# Patient Record
Sex: Male | Born: 1985 | Race: White | Hispanic: No | Marital: Married | Smoking: Current every day smoker
Health system: Southern US, Community
[De-identification: ages and names within clinical notes are randomized; demographics above are authoritative.]

## PROBLEM LIST (undated history)

## (undated) DIAGNOSIS — T7840XA Allergy, unspecified, initial encounter: Secondary | ICD-10-CM

## (undated) DIAGNOSIS — D688 Other specified coagulation defects: Secondary | ICD-10-CM

## (undated) DIAGNOSIS — C801 Malignant (primary) neoplasm, unspecified: Secondary | ICD-10-CM

## (undated) HISTORY — DX: Malignant (primary) neoplasm, unspecified: C80.1

## (undated) HISTORY — PX: OTHER SURGICAL HISTORY: SHX169

## (undated) HISTORY — DX: Allergy, unspecified, initial encounter: T78.40XA

## (undated) HISTORY — PX: TONSILLECTOMY: SUR1361

---
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
## 2015-07-11 ENCOUNTER — Ambulatory Visit (INDEPENDENT_AMBULATORY_CARE_PROVIDER_SITE_OTHER): Payer: No Typology Code available for payment source

## 2015-07-11 ENCOUNTER — Ambulatory Visit (INDEPENDENT_AMBULATORY_CARE_PROVIDER_SITE_OTHER): Payer: No Typology Code available for payment source | Admitting: Family Medicine

## 2015-07-11 ENCOUNTER — Encounter: Payer: Self-pay | Admitting: Family Medicine

## 2015-07-11 VITALS — BP 124/77 | HR 80 | Temp 97.8°F | Ht 72.0 in | Wt 142.6 lb

## 2015-07-11 DIAGNOSIS — R599 Enlarged lymph nodes, unspecified: Secondary | ICD-10-CM

## 2015-07-11 DIAGNOSIS — C8118 Nodular sclerosis classical Hodgkin lymphoma, lymph nodes of multiple sites: Secondary | ICD-10-CM | POA: Insufficient documentation

## 2015-07-11 DIAGNOSIS — Z139 Encounter for screening, unspecified: Secondary | ICD-10-CM | POA: Diagnosis not present

## 2015-07-11 DIAGNOSIS — R590 Localized enlarged lymph nodes: Secondary | ICD-10-CM

## 2015-07-11 DIAGNOSIS — R059 Cough, unspecified: Secondary | ICD-10-CM

## 2015-07-11 DIAGNOSIS — Z832 Family history of diseases of the blood and blood-forming organs and certain disorders involving the immune mechanism: Secondary | ICD-10-CM | POA: Diagnosis not present

## 2015-07-11 DIAGNOSIS — R05 Cough: Secondary | ICD-10-CM | POA: Diagnosis not present

## 2015-07-11 NOTE — Progress Notes (Addendum)
HPI  Patient presents today here to establish care and discuss lymph node in his neck. Patient states that he noticed a large mass just above this right clavicle about 1-2 weeks ago, she's not sure how long its been there. It is not painful. He has had a lingering cough for a few months. He does not seem to be changing, he has no other symptoms of illness besides cough. He has no ear pain. He also has swollen inguinal lymph nodes, these of been present for 10+ years His wife has had a baby 11 months ago and has been tested for GCC and HSV with negative results  He is a smoker.  PMH: Smoking status noted, positive family history for diabetes.  Past medical, surgical, social, family history reviewed and updated in EMR ROS: Per HPI  Objective: BP 124/77 mmHg  Pulse 80  Temp(Src) 97.8 F (36.6 C) (Oral)  Ht 6' (1.829 m)  Wt 142 lb 9.6 oz (64.683 kg)  BMI 19.34 kg/m2 Gen: NAD, alert, cooperative with exam HEENT: NCAT, nares clear, oropharynx clear, TMs normal bilaterally, Neck: Right subclavicular area with approximately 4 cm x 3 cm firm mobile mass consistent with large swollen lymph node. Nontender to palpation CV: RRR, good S1/S2, no murmur Resp: CTABL, no wheezes, non-labored Abd: SNTND, BS present, no guarding or organomegaly Ext: No edema, warm Neuro: Alert and oriented, strength 5/5 and sensation intact in bilateral lower extremities  DG chest Hyperexpanded vs normal appearance for tall thin male, no acute findings.   Assessment and plan:  # Supraclavicular lymphadenopathy, neck mass New problem Likely large supraclavicular lymph node, given how large it is in for medicines I'm going to go ahead and evaluate with an ultrasound Discussed next steps after ultrasound including possible biopsy by her nose and throat surgery. basic labs  # Inguinal lymphadenopathy Persistent for over 10 years Discussed most likely etiologies are HSV, GC C, however he's had a baby in  the last 1 year and his wife has been checked several times with negative test results For now watchful waiting, workup neck mass first  # Smoking Encouraged cessation, he is contemplative  family history of diabetes, encouraged dietary changes at this point      Orders Placed This Encounter  Procedures  . US Soft Tissue Head/Neck    Standing Status: Future     Number of Occurrences:      Standing Expiration Date: 09/09/2016    Order Specific Question:  Reason for Exam (SYMPTOM  OR DIAGNOSIS REQUIRED)    Answer:  supraclavicular lymphadenoapathy    Order Specific Question:  Preferred imaging location?    Answer:  [facility]  . CBC with Differential  . CMP14+EGFR    Sam Bradshaw, MD [facility] 07/11/2015, 4:16 PM      

## 2015-07-11 NOTE — Addendum Note (Signed)
Addended by: Nigel Berthold C on: 07/11/2015 04:40 PM   Modules accepted: Orders

## 2015-07-11 NOTE — Patient Instructions (Signed)
Great to meet you!  We will call within 1 week with blood test results, we will call within 1 week with an ultrasound appointment  Consider backing off of sodas  Consider stopping sodas

## 2015-07-12 ENCOUNTER — Other Ambulatory Visit: Payer: Self-pay | Admitting: Family Medicine

## 2015-07-12 MED ORDER — ALBUTEROL SULFATE HFA 108 (90 BASE) MCG/ACT IN AERS: 2.0000 | INHALATION_SPRAY | Freq: Four times a day (QID) | RESPIRATORY_TRACT | Status: DC | PRN

## 2015-07-13 ENCOUNTER — Telehealth: Payer: Self-pay | Admitting: Family Medicine

## 2015-07-13 ENCOUNTER — Ambulatory Visit (HOSPITAL_COMMUNITY)
Admission: RE | Admit: 2015-07-13 | Discharge: 2015-07-13 | Disposition: A | Discharge status: Home / Self Care | Payer: No Typology Code available for payment source | Source: Ambulatory Visit | Attending: Family Medicine | Admitting: Family Medicine

## 2015-07-13 DIAGNOSIS — R599 Enlarged lymph nodes, unspecified: Secondary | ICD-10-CM | POA: Diagnosis present

## 2015-07-13 DIAGNOSIS — R590 Localized enlarged lymph nodes: Secondary | ICD-10-CM

## 2015-07-13 NOTE — Telephone Encounter (Signed)
Patient aware that we have not received all results back yet.

## 2015-07-15 ENCOUNTER — Telehealth: Payer: Self-pay | Admitting: Family Medicine

## 2015-07-15 DIAGNOSIS — R590 Localized enlarged lymph nodes: Secondary | ICD-10-CM

## 2015-07-15 NOTE — Telephone Encounter (Signed)
Pt is aware of labs except Factor 5 on MyChart is wanting to know about CT result

## 2015-07-18 LAB — CBC WITH DIFFERENTIAL/PLATELET
Basophils Absolute: 0.1 10*3/uL (ref 0.0–0.2)
Basos: 1 %
EOS (ABSOLUTE): 0.8 10*3/uL — ABNORMAL HIGH (ref 0.0–0.4)
Eos: 11 %
Hematocrit: 45 % (ref 37.5–51.0)
Hemoglobin: 15.7 g/dL (ref 12.6–17.7)
Immature Grans (Abs): 0 10*3/uL (ref 0.0–0.1)
Immature Granulocytes: 0 %
Lymphocytes Absolute: 1.8 10*3/uL (ref 0.7–3.1)
Lymphs: 26 %
MCH: 31.7 pg (ref 26.6–33.0)
MCHC: 34.9 g/dL (ref 31.5–35.7)
MCV: 91 fL (ref 79–97)
Monocytes Absolute: 0.8 10*3/uL (ref 0.1–0.9)
Monocytes: 12 %
Neutrophils Absolute: 3.5 10*3/uL (ref 1.4–7.0)
Neutrophils: 50 %
Platelets: 269 10*3/uL (ref 150–379)
RBC: 4.96 x10E6/uL (ref 4.14–5.80)
RDW: 13.9 % (ref 12.3–15.4)
WBC: 7.1 10*3/uL (ref 3.4–10.8)

## 2015-07-18 LAB — CMP14+EGFR
ALT: 13 IU/L (ref 0–44)
AST: 18 IU/L (ref 0–40)
Albumin/Globulin Ratio: 2.3 — ABNORMAL HIGH (ref 1.2–2.2)
Albumin: 5.1 g/dL (ref 3.5–5.5)
Alkaline Phosphatase: 81 IU/L (ref 39–117)
BUN/Creatinine Ratio: 12 (ref 9–20)
BUN: 11 mg/dL (ref 6–20)
Bilirubin Total: 0.3 mg/dL (ref 0.0–1.2)
CO2: 25 mmol/L (ref 18–29)
Calcium: 9.2 mg/dL (ref 8.7–10.2)
Chloride: 100 mmol/L (ref 96–106)
Creatinine, Ser: 0.91 mg/dL (ref 0.76–1.27)
GFR calc Af Amer: 129 mL/min/{1.73_m2} (ref >59)
GFR calc non Af Amer: 112 mL/min/{1.73_m2} (ref >59)
Globulin, Total: 2.2 g/dL (ref 1.5–4.5)
Glucose: 100 mg/dL — ABNORMAL HIGH (ref 65–99)
Potassium: 3.9 mmol/L (ref 3.5–5.2)
Sodium: 143 mmol/L (ref 134–144)
Total Protein: 7.3 g/dL (ref 6.0–8.5)

## 2015-07-18 LAB — FACTOR 5 LEIDEN

## 2015-07-18 NOTE — Telephone Encounter (Signed)
There is no result from a CT scan, there is an ultrasound and to x-ray results on his chart but no CT scan, please forward to Dr. Wendi Snipes 1 CT scan is in

## 2015-07-18 NOTE — Telephone Encounter (Signed)
Called and discussed US findings, will proceed with CT neck and [place] bx  Concerning for neoplasia.   Laroy Apple, MD [facility] 07/18/2015, 8:00 AM

## 2015-07-19 ENCOUNTER — Other Ambulatory Visit: Payer: Self-pay

## 2015-07-19 ENCOUNTER — Telehealth: Payer: Self-pay | Admitting: Family Medicine

## 2015-07-19 DIAGNOSIS — D6851 Activated protein C resistance: Secondary | ICD-10-CM

## 2015-07-19 NOTE — Telephone Encounter (Signed)
Called and discussed labs.  He has factor V laden positive. His father is also and had a blood clot before the age of 43. His father takes an aspirin a day, I explained that this was not a terrible idea, I do recommend daily aspirin for him considering his father's early clot.  I also explained likely increased risk of clot and recommended close monitoring for any signs.  Even if he does not take a daily aspirin and did recommend an aspirin for long car rides or plane trips.Laroy Apple, MD [facility] 07/19/2015, 2:01 PM

## 2015-07-21 ENCOUNTER — Ambulatory Visit (HOSPITAL_COMMUNITY)
Admission: RE | Admit: 2015-07-21 | Discharge: 2015-07-21 | Disposition: A | Discharge status: Home / Self Care | Payer: No Typology Code available for payment source | Source: Ambulatory Visit | Attending: Family Medicine | Admitting: Family Medicine

## 2015-07-21 DIAGNOSIS — R599 Enlarged lymph nodes, unspecified: Secondary | ICD-10-CM | POA: Diagnosis present

## 2015-07-21 DIAGNOSIS — R590 Localized enlarged lymph nodes: Secondary | ICD-10-CM | POA: Insufficient documentation

## 2015-07-21 MED ORDER — IOPAMIDOL (ISOVUE-300) INJECTION 61%
75.0000 mL | Freq: Once | INTRAVENOUS | Status: AC | PRN
  Administered 2015-07-21: 75 mL via INTRAVENOUS

## 2015-07-26 ENCOUNTER — Other Ambulatory Visit: Payer: Self-pay | Admitting: Family Medicine

## 2015-07-26 DIAGNOSIS — R590 Localized enlarged lymph nodes: Secondary | ICD-10-CM

## 2015-07-27 ENCOUNTER — Ambulatory Visit (HOSPITAL_COMMUNITY): Admission: RE | Admit: 2015-07-27 | Payer: No Typology Code available for payment source | Source: Ambulatory Visit

## 2015-07-28 ENCOUNTER — Other Ambulatory Visit: Payer: Self-pay | Admitting: Family Medicine

## 2015-07-28 ENCOUNTER — Ambulatory Visit (HOSPITAL_COMMUNITY)
Admission: RE | Admit: 2015-07-28 | Discharge: 2015-07-28 | Disposition: A | Discharge status: Home / Self Care | Payer: No Typology Code available for payment source | Source: Ambulatory Visit | Attending: Family Medicine | Admitting: Family Medicine

## 2015-07-28 ENCOUNTER — Encounter (HOSPITAL_COMMUNITY): Payer: Self-pay

## 2015-07-28 DIAGNOSIS — R590 Localized enlarged lymph nodes: Secondary | ICD-10-CM

## 2015-07-28 DIAGNOSIS — R599 Enlarged lymph nodes, unspecified: Secondary | ICD-10-CM | POA: Diagnosis not present

## 2015-07-28 DIAGNOSIS — C8190 Hodgkin lymphoma, unspecified, unspecified site: Secondary | ICD-10-CM | POA: Insufficient documentation

## 2015-07-28 MED ORDER — LIDOCAINE HCL (PF) 2 % IJ SOLN
INTRAMUSCULAR | Status: AC
  Administered 2015-07-28: 10 mL
  Filled 2015-07-28: qty 20

## 2015-07-28 NOTE — Discharge Instructions (Signed)

## 2015-08-01 ENCOUNTER — Telehealth: Payer: Self-pay

## 2015-08-01 NOTE — Telephone Encounter (Signed)
lmtcb

## 2015-08-01 NOTE — Telephone Encounter (Signed)
Called patient so he can come in tomorrow to discuss results with Dr.Bradshaw. No answer, will try to call patient again.

## 2015-08-02 ENCOUNTER — Encounter: Payer: Self-pay | Admitting: Family Medicine

## 2015-08-02 ENCOUNTER — Ambulatory Visit (INDEPENDENT_AMBULATORY_CARE_PROVIDER_SITE_OTHER): Payer: No Typology Code available for payment source | Admitting: Family Medicine

## 2015-08-02 VITALS — BP 121/77 | HR 83 | Temp 97.8°F | Ht 73.83 in | Wt 143.8 lb

## 2015-08-02 DIAGNOSIS — D6851 Activated protein C resistance: Secondary | ICD-10-CM | POA: Diagnosis not present

## 2015-08-02 DIAGNOSIS — C8191 Hodgkin lymphoma, unspecified, lymph nodes of head, face, and neck: Secondary | ICD-10-CM

## 2015-08-02 NOTE — Progress Notes (Signed)
   HPI  Patient presents today here for follow-up of lymph node biopsy.  Patient was seen a few weeks ago for enlarged right supraclavicular lymph node. He's had a biopsy of that lymph node which showed Hodgkin lymphoma. He's doing well and actually expected the diagnosis.  He's done lots of research online and had a family member who also had Hodgkin lymphoma.  He is also been found to have factor V leiden, which is in the family as well and he understands.  He is in remarkably good spirits, he has no complaints today. He has had a lingering cough for a few months, he's also had 10 year history of bilateral inguinal lymph node enlargement.  PMH: Smoking status noted ROS: Per HPI  Objective: BP 121/77 mmHg  Pulse 83  Temp(Src) 97.8 F (36.6 C) (Oral)  Ht 6' 1.83" (1.875 m)  Wt 143 lb 12.8 oz (65.227 kg)  BMI 18.55 kg/m2 Gen: NAD, alert, cooperative with exam HEENT: NCAT Neck: Large lymph node palpable at the right supraclavicular area, biopsy site well-healed with mild erythema CV: RRR, good S1/S2, no murmur Resp: CTABL, no wheezes, non-labored Ext: No edema, warm Neuro: Alert and oriented, No gross deficits  Assessment and plan:  # Hodgkin lymphoma Refer to oncology Provided information and discussed. All of his questions were answered  # Factor V Leiden Considering current known cancer I recommended starting a daily aspirin He understands this as well      Orders Placed This Encounter  Procedures  . Ambulatory referral to Oncology    Referral Priority:  Routine    Referral Type:  Consultation    Referral Reason:  Specialty Services Required    Number of Visits Requested:  [place], MD [facility] 08/02/2015, 10:48 AM

## 2015-08-05 NOTE — Telephone Encounter (Signed)
Patient was seen on 6/6 to discuss results.

## 2015-08-10 ENCOUNTER — Encounter: Payer: Self-pay | Admitting: Family Medicine

## 2015-08-11 ENCOUNTER — Telehealth: Payer: Self-pay | Admitting: Hematology and Oncology

## 2015-08-11 ENCOUNTER — Ambulatory Visit (HOSPITAL_BASED_OUTPATIENT_CLINIC_OR_DEPARTMENT_OTHER): Payer: No Typology Code available for payment source | Admitting: Hematology and Oncology

## 2015-08-11 ENCOUNTER — Encounter: Payer: Self-pay | Admitting: Hematology and Oncology

## 2015-08-11 VITALS — BP 118/75 | HR 68 | Temp 98.3°F | Resp 18 | Ht 73.83 in | Wt 145.6 lb

## 2015-08-11 DIAGNOSIS — D6851 Activated protein C resistance: Secondary | ICD-10-CM | POA: Diagnosis not present

## 2015-08-11 DIAGNOSIS — C8118 Nodular sclerosis classical Hodgkin lymphoma, lymph nodes of multiple sites: Secondary | ICD-10-CM

## 2015-08-11 DIAGNOSIS — C8174 Other classical Hodgkin lymphoma, lymph nodes of axilla and upper limb: Secondary | ICD-10-CM

## 2015-08-11 DIAGNOSIS — Z720 Tobacco use: Secondary | ICD-10-CM | POA: Diagnosis not present

## 2015-08-11 NOTE — Assessment & Plan Note (Signed)
I spent some time counseling the patient the importance of tobacco cessation. he is currently attempting to quit on his own 

## 2015-08-11 NOTE — Assessment & Plan Note (Signed)
The patient is a carrier without any diagnosis of blood clots in the past. Observe only.

## 2015-08-11 NOTE — Telephone Encounter (Signed)
Gave and printed appt sched and avs fo rpt for June thru Aug..emailed DC for echo auth

## 2015-08-11 NOTE — Telephone Encounter (Signed)
Patient called back agreeing to today's appointment with Dr. Alvy Bimler. Appointment today at 12:15pm with an arrival of 11:45 per MD. Demographics verified. Letter to referring.

## 2015-08-11 NOTE — Assessment & Plan Note (Addendum)
Based on clinical examination, I suspect the patient may have at least stage III disease due to palpable lymph nodes on his neck and the inguinal region. He has B symptoms with night sweats and diffuse skin itching. Denies recent weight loss I will order PET CT scan for staging. The patient will also need echocardiogram, pulmonary function tests, blood work, bone marrow biopsy and port placement before we start treatment. I will try to see if we can arrange for port placement and CT-guided bone marrow biopsy to be done in the same setting. He would benefit from systemic chemotherapy. I plan to see him back in 2 weeks to review all test results and for final chemotherapy consent. Hopefully, we can get him started on treatment within the next 2 weeks.

## 2015-08-11 NOTE — Telephone Encounter (Signed)
s.w. pt and advised on 6.27 echo....pt ok and aware

## 2015-08-11 NOTE — Telephone Encounter (Signed)
Telephone call to schedule appointment today with Dr. Alvy Bimler. Patient needed to talk to employer.

## 2015-08-11 NOTE — Progress Notes (Signed)
[facility] CONSULT NOTE  Patient Care Team: Timmothy Euler, MD as PCP - General (Family Medicine)  CHIEF COMPLAINTS/PURPOSE OF CONSULTATION:  Newly diagnosed classical Hodgkin lymphoma  HISTORY OF PRESENTING ILLNESS:  Ross Hansen 30 y.o. male is here because of recent diagnosis of lymphoma. He is here today accompanied by his wife and his daughter. The patient had palpated lymphadenopathy in his groin for a long time. Over the past year, he is complaining of skin itching and occasional night sweats. 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   07/28/2015 Pathology Results Accession: [id number] core needle biopsy of the right supraclavicular region came back positive for classical Hodgkin lymphoma   He denies fatigue. Denies anorexia or abnormal weight loss. No recent infection.  MEDICAL HISTORY:  Past Medical History  Diagnosis Date  . Allergy   . Cancer [facility])     hodgkin lymphoma    SURGICAL HISTORY: Past Surgical History  Procedure Laterality Date  . Tonsillectomy    . Tubes in ears Bilateral     SOCIAL HISTORY: Social History   Social History  . Marital Status: Married    Spouse Name: N/A  . Number of Children: N/A  . Years of Education: N/A   Occupational History  . Not on file.   Social History Main Topics  . Smoking status: Current Every Day Smoker -- 0.50 packs/day for 19 years    Types: Cigarettes  . Smokeless tobacco:  Never Used  . Alcohol Use: Yes  . Drug Use: No  . 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 Not on file   Social History Narrative    FAMILY HISTORY: Family History  Problem Relation Age of Onset  . Depression Mother   . Hypertension Father   . Diabetes Maternal Grandmother   . Diabetes Maternal Grandfather   . Cancer Maternal Grandfather     lung ca  . Diabetes Paternal Grandmother   . Diabetes Paternal Grandfather     ALLERGIES:  has No Known Allergies.  MEDICATIONS:  Current Outpatient Prescriptions  Medication Sig Dispense Refill  . albuterol (PROVENTIL HFA;VENTOLIN HFA) 108 (90 Base) MCG/ACT inhaler Inhale 2 puffs into the lungs every 6 (six) hours as needed for wheezing or shortness of breath. 1 Inhaler 0  . naproxen sodium (ANAPROX) 220 MG tablet Take 220 mg by mouth 2 (two) times daily with a meal. As needed for back pain     No current facility-administered medications for this visit.    REVIEW OF SYSTEMS:   Constitutional: Denies fevers, chills or abnormal night sweats Eyes: Denies blurriness of vision, double vision or watery eyes Ears, nose, mouth, throat, and face: Denies mucositis or sore throat Respiratory: Denies cough, dyspnea or wheezes Cardiovascular: Denies palpitation, chest discomfort or lower extremity swelling Gastrointestinal:  Denies nausea, heartburn or change in bowel habits Skin: Denies abnormal skin rashes Neurological:Denies numbness, tingling or new weaknesses Behavioral/Psych: Mood is stable, no new changes  All other systems  were reviewed with the patient and are negative.  PHYSICAL EXAMINATION: ECOG PERFORMANCE STATUS: 1 - Symptomatic but completely ambulatory  Filed Vitals:   08/11/15 1200  BP: 118/75  Pulse: 68  Temp: 98.3 F (36.8 C)  Resp: 18   Filed Weights   08/11/15 1200  Weight: 145 lb 9.6 oz (66.044 kg)    GENERAL:alert, no distress and  comfortable SKIN: skin color, texture, turgor are normal, no rashes or significant lesions EYES: normal, conjunctiva are pink and non-injected, sclera clear OROPHARYNX:no exudate, no erythema and lips, buccal mucosa, and tongue normal  NECK: supple, thyroid normal size, non-tender, without nodularity LYMPH:  He has palpable lymphadenopathy in the cervical region, supraclavicular and inguinal region bilaterally LUNGS: clear to auscultation and percussion with normal breathing effort HEART: regular rate & rhythm and no murmurs and no lower extremity edema ABDOMEN:abdomen soft, non-tender and normal bowel sounds Musculoskeletal:no cyanosis of digits and no clubbing  PSYCH: alert & oriented x 3 with fluent speech NEURO: no focal motor/sensory deficits  LABORATORY DATA:  I have reviewed the data as listed Lab Results  Component Value Date   WBC 7.1 07/11/2015   HCT 45.0 07/11/2015   MCV 91 07/11/2015   PLT 269 07/11/2015    Recent Labs  07/11/15 1630  NA 143  K 3.9  CL 100  CO2 25  GLUCOSE 100*  BUN 11  CREATININE 0.91  CALCIUM 9.2  GFRNONAA 112  GFRAA 129  PROT 7.3  ALBUMIN 5.1  AST 18  ALT 13  ALKPHOS 81  BILITOT 0.3    RADIOGRAPHIC STUDIES: I have personally reviewed the radiological images as listed and agreed with the findings in the report. Ct Soft Tissue Neck W Contrast  07/21/2015  CLINICAL DATA:  Extensive supraclavicular lymphadenopathy. Right neck mass. EXAM: CT NECK WITH CONTRAST TECHNIQUE: Multidetector CT imaging of the neck was performed using the standard protocol following the bolus administration of intravenous contrast. CONTRAST:  78m ISOVUE-300 IOPAMIDOL (ISOVUE-300) INJECTION 61% COMPARISON:  Neck ultrasound 07/13/2015 FINDINGS: Pharynx and larynx: No mass identified. No retropharyngeal inflammation or fluid. Salivary glands: Submandibular and parotid glands are unremarkable. Thyroid: Unremarkable. Lymph nodes: Right supraclavicular lymph nodes measure  up to 3.5 x 1.9 cm (series 2, image 99). Abnormal level IV lymph nodes more superiorly measure up to approximately 1.1 cm in short axis. Mildly enlarged left level IV/supraclavicular lymph nodes measure up to 8 mm in short axis. Lymphadenopathy is present throughout the imaged portion of the mediastinum. Prevascular and AP window lymph nodes measure up to 1.8 cm in short axis. Vascular: Major vascular structures of the neck appear patent. Limited intracranial: Unremarkable. Visualized orbits: Unremarkable. Mastoids and visualized paranasal sinuses: Clear. Skeleton: No suspicious lytic or blastic osseous lesion identified. Upper chest: Minimal scarring in the lung apices. Indeterminate 4 mm apical right upper lobe nodule (series 6, image 109). IMPRESSION: Bulky right supraclavicular and mediastinal lymphadenopathy and mild left supraclavicular lymphadenopathy, most concerning for lymphoproliferative disease. Electronically Signed   By: ALogan BoresM.D.   On: 07/21/2015 21:58   [place]Soft Tissue Head/neck  07/14/2015  CLINICAL DATA:  Supraclavicular lymphadenopathy on the right side for 2-3 weeks. History of tobacco use. EXAM: ULTRASOUND OF HEAD/NECK SOFT TISSUES TECHNIQUE: Ultrasound examination of the head and neck soft tissues was performed in the area of clinical concern. COMPARISON:  Chest radiograph 07/11/2015 FINDINGS: There are multiple enlarged hypoechoic lymph nodes in the right supraclavicular area. Index lymph node measures 3.4 x 1.5 x 3.1 cm. More  heterogeneous hypoechoic structure along the medial aspect of the right neck. Unclear if this heterogeneous structure is involving thyroid tissue or represents another enlarged lymph node. IMPRESSION: Extensive lymphadenopathy in the right supraclavicular region. Findings raise concern for a neoplastic process. Recommend further characterization of the lymphadenopathy with neck CT. This lymphadenopathy would also be amendable to ultrasound-guided biopsy. These  results will be called to the ordering clinician or representative by the Radiologist Assistant, and communication documented in the PACS or zVision Dashboard. Electronically Signed   By: Markus Daft M.D.   On: 07/14/2015 08:34   US Guided Needle Placement  07/28/2015  CLINICAL DATA:  Right supraclavicular lymph node. EXAM: ULTRASOUND GUIDED CORE NEEDLE BIOPSY OF A right supraclavicular NODE COMPARISON:  Ultrasound of Jul 13, 2015. FINDINGS: I met with the patient and we discussed the procedure of ultrasound-guided biopsy, including benefits and alternatives. We discussed the high likelihood of a successful procedure. We discussed the risks of the procedure, including infection and bleeding. Informed written consent was given. The usual time-out protocol was performed immediately prior to the procedure. Using sterile technique and 1% Lidocaine as local anesthetic, under direct ultrasound visualization, a 18 gauge core biopsy device was used to perform biopsy of right supraclavicular lymph node using a anterior approach. Three core samples were obtained and delivered to pathology. Needle was removed and appropriate dressing was applied. No immediate complications were noted. IMPRESSION: Ultrasound guided biopsy of right supraclavicular lymph node. No apparent complications. Electronically Signed   By: Marijo Conception, M.D.   On: 07/28/2015 10:06   [place] Core Biopsy  07/28/2015  CLINICAL DATA:  Right supraclavicular lymph node. EXAM: ULTRASOUND GUIDED CORE NEEDLE BIOPSY OF A right supraclavicular NODE COMPARISON:  Ultrasound of Jul 13, 2015. FINDINGS: I met with the patient and we discussed the procedure of ultrasound-guided biopsy, including benefits and alternatives. We discussed the high likelihood of a successful procedure. We discussed the risks of the procedure, including infection and bleeding. Informed written consent was given. The usual time-out protocol was performed immediately prior to the procedure.  Using sterile technique and 1% Lidocaine as local anesthetic, under direct ultrasound visualization, a 18 gauge core biopsy device was used to perform biopsy of right supraclavicular lymph node using a anterior approach. Three core samples were obtained and delivered to pathology. Needle was removed and appropriate dressing was applied. No immediate complications were noted. IMPRESSION: Ultrasound guided biopsy of right supraclavicular lymph node. No apparent complications. Electronically Signed   By: Marijo Conception, M.D.   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 He has B symptoms with night sweats and diffuse skin itching. Denies recent weight loss I will order PET CT scan for staging. The patient will also need echocardiogram, pulmonary function tests, blood work, bone marrow biopsy and port placement before we start treatment. I will try to see if we can arrange for port placement and CT-guided bone marrow biopsy to be done in the same setting. He would benefit from systemic chemotherapy. I plan to see him back in 2 weeks to review all test results and for final chemotherapy consent. Hopefully, we can get him started on treatment within the next 2 weeks.  Factor V Leiden [facility]) The patient is a  carrier without any diagnosis of blood clots in the past. Observe only.  Tobacco abuse I spent some time counseling the patient the importance of tobacco cessation. he is currently attempting to quit on his own   Orders Placed This Encounter  Procedures  . NM PET Image Initial (PI) Skull Base To Thigh    Standing Status: Future     Number of Occurrences:      Standing Expiration Date: 10/10/2016    Order Specific Question:  Reason for Exam (SYMPTOM  OR  DIAGNOSIS REQUIRED)    Answer:  staging lymphoma    Order Specific Question:  Preferred imaging location?    Answer:  [facility]  . IR Fluoro Guide CV Line Right    Indicate type of CVC ordering    Standing Status: Future     Number of Occurrences:      Standing Expiration Date: 10/10/2016    Order Specific Question:  Reason for exam:    Answer:  for bone marrow biopsy same day    Order Specific Question:  Preferred Imaging Location?    Answer:  [facility]  . CT BIOPSY    Standing Status: Future     Number of Occurrences:      Standing Expiration Date: 10/10/2016    Order Specific Question:  Reason for Exam (SYMPTOM  OR DIAGNOSIS REQUIRED)    Answer:  staging lymphoma    Order Specific Question:  Preferred imaging location?    Answer:  [facility]  . CBC with Differential/Platelet    Standing Status: Future     Number of Occurrences:      Standing Expiration Date: 09/14/2016  . Comprehensive metabolic panel    Standing Status: Future     Number of Occurrences:      Standing Expiration Date: 09/14/2016  . Lactate dehydrogenase (LDH)    Standing Status: Future     Number of Occurrences:      Standing Expiration Date: 09/14/2016  . Sedimentation rate    Standing Status: Future     Number of Occurrences:      Standing Expiration Date: 09/14/2016  . ECHOCARDIOGRAM COMPLETE    Standing Status: Future     Number of Occurrences:      Standing Expiration Date: 11/09/2016    Order Specific Question:  Where should this test be performed    Answer:  [facility]    Order Specific Question:  Complete or Limited study?    Answer:  Limited    Order Specific Question:  With Image Enhancing Agent or without Image Enhancing Agent?    Answer:  Without Image Enhancing Agent    Order Specific Question:  Contraindication for Image Enhancing Agent?    Answer:  Per Appointment Conversion    Order Specific Question:  Reason for exam-Echo    Answer:  Chemo  V67.2 / Z09   . Pulmonary Function Test    Standing Status: Future     Number of Occurrences:      Standing Expiration Date: 11/09/2016    Order Specific Question:  Where should this test be performed?    Answer:  [facility]    Order Specific Question:  Full PFT: includes the following: basic spirometry, spirometry pre & post bronchodilator, diffusion capacity (DLCO), lung volumes    Answer:  Full PFT    Order Specific Question:  MIP/MEP    Answer:  Yes    Order Specific Question:  6 minute walk  Answer:  No    Order Specific Question:  ABG    Answer:  No    Order Specific Question:  Diffusion capacity (DLCO)    Answer:  Yes    Order Specific Question:  Lung volumes    Answer:  Yes    Order Specific Question:  Methacholine challenge    Answer:  No      All questions were answered. The patient knows to call the clinic with any problems, questions or concerns. I spent 40 minutes counseling the patient face to face. The total time spent in the appointment was 60 minutes and more than 50% was on counseling.     [facility], [place], MD 08/11/2015 2:33 PM

## 2015-08-12 ENCOUNTER — Ambulatory Visit: Payer: No Typology Code available for payment source | Admitting: Hematology and Oncology

## 2015-08-15 ENCOUNTER — Telehealth: Payer: Self-pay | Admitting: Hematology and Oncology

## 2015-08-15 NOTE — Telephone Encounter (Signed)
S.w. Pt and advised on 6.27 MD appt moved  to 6.29...the patient ok and aware

## 2015-08-16 ENCOUNTER — Encounter: Payer: Self-pay | Admitting: Hematology and Oncology

## 2015-08-16 NOTE — Progress Notes (Signed)
Spoke to pt regarding copay assistance w/ [employer].  Pt will let me know if he would like me to apply in his behalf.  He thinks he has met his deductible for the year.  We also discussed the [place].  He would like to apply for that so he will fax or email his proof of income.  I will see him on 08/23/15.

## 2015-08-18 ENCOUNTER — Other Ambulatory Visit: Payer: Self-pay | Admitting: General Surgery

## 2015-08-19 ENCOUNTER — Other Ambulatory Visit: Payer: Self-pay | Admitting: Radiology

## 2015-08-22 ENCOUNTER — Other Ambulatory Visit: Payer: Self-pay | Admitting: Hematology and Oncology

## 2015-08-22 ENCOUNTER — Ambulatory Visit (HOSPITAL_COMMUNITY)
Admission: RE | Admit: 2015-08-22 | Discharge: 2015-08-22 | Disposition: A | Discharge status: Home / Self Care | Payer: No Typology Code available for payment source | Source: Ambulatory Visit | Attending: Hematology and Oncology | Admitting: Hematology and Oncology

## 2015-08-22 ENCOUNTER — Encounter (HOSPITAL_COMMUNITY): Payer: Self-pay

## 2015-08-22 ENCOUNTER — Encounter: Payer: Self-pay | Admitting: Hematology and Oncology

## 2015-08-22 DIAGNOSIS — D688 Other specified coagulation defects: Secondary | ICD-10-CM | POA: Insufficient documentation

## 2015-08-22 DIAGNOSIS — Z7982 Long term (current) use of aspirin: Secondary | ICD-10-CM | POA: Insufficient documentation

## 2015-08-22 DIAGNOSIS — C8174 Other classical Hodgkin lymphoma, lymph nodes of axilla and upper limb: Secondary | ICD-10-CM

## 2015-08-22 DIAGNOSIS — C8190 Hodgkin lymphoma, unspecified, unspecified site: Secondary | ICD-10-CM | POA: Diagnosis not present

## 2015-08-22 HISTORY — DX: Other specified coagulation defects: D68.8

## 2015-08-22 LAB — CBC
HEMATOCRIT: 43.7 % (ref 39.0–52.0)
Hemoglobin: 16.1 g/dL (ref 13.0–17.0)
MCH: 31.6 pg (ref 26.0–34.0)
MCHC: 36.8 g/dL — AB (ref 30.0–36.0)
MCV: 85.9 fL (ref 78.0–100.0)
Platelets: 232 10*3/uL (ref 150–400)
RBC: 5.09 MIL/uL (ref 4.22–5.81)
RDW: 12.8 % (ref 11.5–15.5)
WBC: 7.6 10*3/uL (ref 4.0–10.5)

## 2015-08-22 LAB — BONE MARROW EXAM

## 2015-08-22 LAB — PROTIME-INR
INR: 1.08 (ref 0.00–1.49)
Prothrombin Time: 14.2 seconds (ref 11.6–15.2)

## 2015-08-22 LAB — APTT: APTT: 28 s (ref 24–37)

## 2015-08-22 MED ORDER — CEFAZOLIN SODIUM-DEXTROSE 2-4 GM/100ML-% IV SOLN
2.0000 g | Freq: Once | INTRAVENOUS | Status: AC
  Administered 2015-08-22: 2 g via INTRAVENOUS
  Filled 2015-08-22: qty 100

## 2015-08-22 MED ORDER — FENTANYL CITRATE (PF) 100 MCG/2ML IJ SOLN
INTRAMUSCULAR | Status: AC | PRN
  Administered 2015-08-22: 25 ug via INTRAVENOUS

## 2015-08-22 MED ORDER — HEPARIN SOD (PORK) LOCK FLUSH 100 UNIT/ML IV SOLN
INTRAVENOUS | Status: AC | PRN
  Administered 2015-08-22: 500 [IU]

## 2015-08-22 MED ORDER — MIDAZOLAM HCL 2 MG/2ML IJ SOLN
INTRAMUSCULAR | Status: AC
  Filled 2015-08-22: qty 6

## 2015-08-22 MED ORDER — HEPARIN SOD (PORK) LOCK FLUSH 100 UNIT/ML IV SOLN
INTRAVENOUS | Status: AC
Start: 2015-08-22 — End: 2015-08-22
  Filled 2015-08-22: qty 5

## 2015-08-22 MED ORDER — MIDAZOLAM HCL 2 MG/2ML IJ SOLN
INTRAMUSCULAR | Status: AC | PRN
  Administered 2015-08-22 (×2): 1 mg via INTRAVENOUS
  Administered 2015-08-22: 2 mg via INTRAVENOUS

## 2015-08-22 MED ORDER — SODIUM CHLORIDE 0.9 % IV SOLN: INTRAVENOUS | Status: DC

## 2015-08-22 MED ORDER — FENTANYL CITRATE (PF) 100 MCG/2ML IJ SOLN
INTRAMUSCULAR | Status: AC | PRN
  Administered 2015-08-22: 25 ug via INTRAVENOUS
  Administered 2015-08-22: 50 ug via INTRAVENOUS
  Administered 2015-08-22: 25 ug via INTRAVENOUS

## 2015-08-22 MED ORDER — LIDOCAINE-EPINEPHRINE (PF) 2 %-1:200000 IJ SOLN
INTRAMUSCULAR | Status: AC | PRN
  Administered 2015-08-22: 20 mL

## 2015-08-22 MED ORDER — SODIUM CHLORIDE 0.9 % IV SOLN: Freq: Once | INTRAVENOUS | Status: DC

## 2015-08-22 MED ORDER — MIDAZOLAM HCL 2 MG/2ML IJ SOLN
INTRAMUSCULAR | Status: AC | PRN
  Administered 2015-08-22 (×2): 1 mg via INTRAVENOUS

## 2015-08-22 MED ORDER — FENTANYL CITRATE (PF) 100 MCG/2ML IJ SOLN
INTRAMUSCULAR | Status: AC
  Filled 2015-08-22: qty 4

## 2015-08-22 NOTE — Procedures (Signed)
R IJ Port cathter placement with US and fluoroscopy No complication No blood loss. See complete dictation in [facility] PACS.  

## 2015-08-22 NOTE — Discharge Instructions (Signed)
Bone Marrow Aspiration and Bone Marrow Biopsy, Care After Refer to this sheet in the next few weeks. These instructions provide you with information about caring for yourself after your procedure. Your health care provider may also give you more specific instructions. Your treatment has been planned according to current medical practices, but problems sometimes occur. Call your health care provider if you have any problems or questions after your procedure. WHAT TO EXPECT AFTER THE PROCEDURE After your procedure, it is common to have:  Soreness or tenderness around the puncture site.  Bruising. HOME CARE INSTRUCTIONS  Take medicines only as directed by your health care provider.  Follow your health care provider's instructions about:  Puncture site care.  Bandage (dressing) changes and removal.  Bathe and shower as directed by your health care provider.  Check your puncture site every day for signs of infection. Watch for:  Redness, swelling, or pain.  Fluid, blood, or pus.  Return to your normal activities as directed by your health care provider.  Keep all follow-up visits as directed by your health care provider. This is important. SEEK MEDICAL CARE IF:  You have a fever.  You have uncontrollable bleeding.  You have redness, swelling, or pain at the site of your puncture.  You have fluid, blood, or pus coming from your puncture site.   This information is not intended to replace advice given to you by your health care provider. Make sure you discuss any questions you have with your health care provider.   Document Released: 09/01/2004 Document Revised: 06/29/2014 Document Reviewed: 02/03/2014 Elsevier Interactive Patient Education 2016 [employer] Moderate Conscious Sedation, Adult, Care After Refer to this sheet in the next few weeks. These instructions provide you with information on caring for yourself after your procedure. Your health care provider may also give  you more specific instructions. Your treatment has been planned according to current medical practices, but problems sometimes occur. Call your health care provider if you have any problems or questions after your procedure. WHAT TO EXPECT AFTER THE PROCEDURE  After your procedure:  You may feel sleepy, clumsy, and have poor balance for several hours.  Vomiting may occur if you eat too soon after the procedure. HOME CARE INSTRUCTIONS  Do not participate in any activities where you could become injured for at least 24 hours. Do not:  Drive.  Swim.  Ride a bicycle.  Operate heavy machinery.  Cook.  Use power tools.  Climb ladders.  Work from a high place.  Do not make important decisions or sign legal documents until you are improved.  If you vomit, drink water, juice, or soup when you can drink without vomiting. Make sure you have little or no nausea before eating solid foods.  Only take over-the-counter or prescription medicines for pain, discomfort, or fever as directed by your health care provider.  Make sure you and your family fully understand everything about the medicines given to you, including what side effects may occur.  You should not drink alcohol, take sleeping pills, or take medicines that cause drowsiness for at least 24 hours.  If you smoke, do not smoke without supervision.  If you are feeling better, you may resume normal activities 24 hours after you were sedated.  Keep all appointments with your health care provider. SEEK MEDICAL CARE IF:  Your skin is pale or bluish in color.  You continue to feel nauseous or vomit.  Your pain is getting worse and is not helped by medicine.  You have bleeding or swelling.  You are still sleepy or feeling clumsy after 24 hours. SEEK IMMEDIATE MEDICAL CARE IF:  You develop a rash.  You have difficulty breathing.  You develop any type of allergic problem.  You have a fever. MAKE SURE YOU:  Understand  these instructions.  Will watch your condition.  Will get help right away if you are not doing well or get worse.   This information is not intended to replace advice given to you by your health care provider. Make sure you discuss any questions you have with your health care provider.   Document Released: 12/03/2012 Document Revised: 03/05/2014 Document Reviewed: 12/03/2012 Elsevier Interactive Patient Education 2016 [place] Insertion, Care After Refer to this sheet in the next few weeks. These instructions provide you with information on caring for yourself after your procedure. Your health care provider may also give you more specific instructions. Your treatment has been planned according to current medical practices, but problems sometimes occur. Call your health care provider if you have any problems or questions after your procedure. WHAT TO EXPECT AFTER THE PROCEDURE After your procedure, it is typical to have the following:   Discomfort at the port insertion site. Ice packs to the area will help.  Bruising on the skin over the port. This will subside in 3-4 days. HOME CARE INSTRUCTIONS  After your port is placed, you will get a manufacturer's information card. The card has information about your port. Keep this card with you at all times.   Know what kind of port you have. There are many types of ports available.   Wear a medical alert bracelet in case of an emergency. This can help alert health care workers that you have a port.   The port can stay in for as long as your health care provider believes it is necessary.   A home health care nurse may give medicines and take care of the port.   You or a family member can get special training and directions for giving medicine and taking care of the port at home.  SEEK MEDICAL CARE IF:   Your port does not flush or you are unable to get a blood return.   You have a fever or chills. SEEK IMMEDIATE  MEDICAL CARE IF:  You have new fluid or pus coming from your incision.   You notice a bad smell coming from your incision site.   You have swelling, pain, or more redness at the incision or port site.   You have chest pain or shortness of breath.   This information is not intended to replace advice given to you by your health care provider. Make sure you discuss any questions you have with your health care provider.   Document Released: 12/03/2012 Document Revised: 02/17/2013 Document Reviewed: 12/03/2012 Elsevier Interactive Patient Education [employer].

## 2015-08-22 NOTE — Procedures (Signed)
CT-guided  R iliac bone marrow aspiration and core biopsy No complication No blood loss. See complete dictation in [facility]  

## 2015-08-22 NOTE — Progress Notes (Signed)
Patient ID: Ross Hansen, male   DOB: 08/18/1985, 30 y.o.   MRN: [id number]    Referring Physician(s): Gorsuch,Ni  Supervising Physician: Hassell, Daniel  Patient Status:  Outpatient  Chief Complaint:  lymphoma  Subjective: Pt familiar to IR service from recent right supraclavicular lymph node biopsy on 07/28/15. He has newly diagnosed classical Hodgkin's lymphoma and presents today for CT-guided bone marrow biopsy and Port-A-Cath placement. He currently denies fever, chills, headache, chest pain, dyspnea, cough, abdominal pain, nausea, vomiting or abnormal bleeding. He does have back pain as well as occasional night sweats. Additional history as below. Past Medical History  Diagnosis Date  . Allergy   . Cancer (HCC)     hodgkin lymphoma  . Familial multiple factor deficiency syndrome, type IV (HCC)     "Factor IV Leiden"   Past Surgical History  Procedure Laterality Date  . Tonsillectomy    . Tubes in ears Bilateral      Allergies: Review of patient's allergies indicates no known allergies.  Medications: Prior to Admission medications   Medication Sig Start Date End Date Taking? Authorizing Provider  aspirin EC 81 MG tablet Take 81 mg by mouth daily. To take 162 mg daily   Yes Historical Provider, MD  ibuprofen (ADVIL,MOTRIN) 200 MG tablet Take 200 mg by mouth every 6 (six) hours as needed.   Yes Historical Provider, MD  albuterol (PROVENTIL HFA;VENTOLIN HFA) 108 (90 Base) MCG/ACT inhaler Inhale 2 puffs into the lungs every 6 (six) hours as needed for wheezing or shortness of breath. 07/12/15   Samuel L Bradshaw, MD  naproxen sodium (ANAPROX) 220 MG tablet Take 220 mg by mouth 2 (two) times daily with a meal. As needed for back pain    Historical Provider, MD     Vital Signs: BP 138/84 mmHg  Pulse 71  Temp(Src) 98.2 F (36.8 C) (Oral)  Resp 18  Ht 6' 1" (1.854 m)  Wt 145 lb (65.772 kg)  BMI 19.13 kg/m2  SpO2 100%  Physical Exam patient awake, alert. Chest  clear to auscultation bilaterally. Heart with regular rate and rhythm. Abdomen soft, positive bowel sounds, nontender; lower extremities with no edema  Imaging: No results found.  Labs:  CBC:  Recent Labs  07/11/15 1630 08/22/15 0700  WBC 7.1 7.6  HGB  --  16.1  HCT 45.0 43.7  PLT 269 232    COAGS:  Recent Labs  08/22/15 0700  INR 1.08  APTT 28    BMP:  Recent Labs  07/11/15 1630  NA 143  K 3.9  CL 100  CO2 25  GLUCOSE 100*  BUN 11  CALCIUM 9.2  CREATININE 0.91  GFRNONAA 112  GFRAA 129    LIVER FUNCTION TESTS:  Recent Labs  07/11/15 1630  BILITOT 0.3  AST 18  ALT 13  ALKPHOS 81  PROT 7.3  ALBUMIN 5.1    Assessment and Plan: Patient with history of newly diagnosed classical Hodgkin's lymphoma. Plan today is for CT-guided bone marrow biopsy as well as Port-A-Cath placement. Details/risks of procedures, including not limited to, internal bleeding, infection, injury to adjacent structures, discussed with patient and family with their understanding and consent.   Electronically Signed: D. Kevin Allred 08/22/2015, 8:15 AM   I spent a total of 25 minutes at the the patient's bedside AND on the patient's hospital floor or unit, greater than 50% of which was counseling/coordinating care for CT-guided bone marrow biopsy and Port-A-Cath placement      

## 2015-08-22 NOTE — Discharge Instructions (Signed)
Bone Marrow Aspiration and Bone Marrow Biopsy °Bone marrow aspiration and bone marrow biopsy are procedures that are done to diagnose blood disorders. You may also have one of these procedures to help diagnose infections or some types of cancer. °Bone marrow is the soft tissue that is inside your bones. Blood cells are produced in bone marrow. For bone marrow aspiration, a sample of tissue in liquid form is removed from inside your bone. For a bone marrow biopsy, a small core of bone marrow tissue is removed. Then these samples are examined under a microscope or tested in a lab. °You may need these procedures if you have an abnormal complete blood count (CBC). The aspiration or biopsy sample is usually taken from the top of your hip bone. Sometimes, an aspiration sample is taken from your chest bone (sternum). °LET YOUR HEALTH CARE PROVIDER KNOW ABOUT: °· Any allergies you have. °· All medicines you are taking, including vitamins, herbs, eye drops, creams, and over-the-counter medicines. °· Previous problems you or members of your family have had with the use of anesthetics. °· Any blood disorders you have. °· Previous surgeries you have had. °· Any medical conditions you may have. °· Whether you are pregnant or you think that you may be pregnant. °RISKS AND COMPLICATIONS °Generally, this is a safe procedure. However, problems may occur, including: °· Infection. °· Bleeding. °BEFORE THE PROCEDURE °· Ask your health care provider about: °¨ Changing or stopping your regular medicines. This is especially important if you are taking diabetes medicines or blood thinners. °¨ Taking medicines such as aspirin and ibuprofen. These medicines can thin your blood. Do not take these medicines before your procedure if your health care provider instructs you not to. °· Plan to have someone take you home after the procedure. °· If you go home right after the procedure, plan to have someone with you for 24 hours. °PROCEDURE  °· An  IV tube may be inserted into one of your veins. °· The injection site will be cleaned with a germ-killing solution (antiseptic). °· You will be given one or more of the following: °¨ A medicine that helps you relax (sedative). °¨ A medicine that numbs the area (local anesthetic). °· The bone marrow sample will be removed as follows: °¨ For an aspiration, a hollow needle will be inserted through your skin and into your bone. Bone marrow fluid will be drawn up into a syringe. °¨ For a biopsy, your health care provider will use a hollow needle to remove a core of tissue from your bone marrow. °· The needle will be removed. °· A bandage (dressing) will be placed over the insertion site and taped in place. °The procedure may vary among health care providers and hospitals. °AFTER THE PROCEDURE °· Your blood pressure, heart rate, breathing rate, and blood oxygen level will be monitored often until the medicines you were given have worn off. °· Return to your normal activities as directed by your health care provider. °  °This information is not intended to replace advice given to you by your health care provider. Make sure you discuss any questions you have with your health care provider. °  °Document Released: 02/16/2004 Document Revised: 06/29/2014 Document Reviewed: 02/03/2014 °Elsevier Interactive Patient Education ©2016 Elsevier Inc. °Bone Marrow Aspiration and Bone Marrow Biopsy, Care After °Refer to this sheet in the next few weeks. These instructions provide you with information about caring for yourself after your procedure. Your health care provider may also give you   more specific instructions. Your treatment has been planned according to current medical practices, but problems sometimes occur. Call your health care provider if you have any problems or questions after your procedure. WHAT TO EXPECT AFTER THE PROCEDURE After your procedure, it is common to have:  Soreness or tenderness around the puncture  site.  Bruising. HOME CARE INSTRUCTIONS  Take medicines only as directed by your health care provider.  Follow your health care provider's instructions about:  Puncture site care.  Bandage (dressing) changes and removal.  Bathe and shower as directed by your health care provider.  Check your puncture site every day for signs of infection. Watch for:  Redness, swelling, or pain.  Fluid, blood, or pus.  Return to your normal activities as directed by your health care provider.  Keep all follow-up visits as directed by your health care provider. This is important. SEEK MEDICAL CARE IF:  You have a fever.  You have uncontrollable bleeding.  You have redness, swelling, or pain at the site of your puncture.  You have fluid, blood, or pus coming from your puncture site.   This information is not intended to replace advice given to you by your health care provider. Make sure you discuss any questions you have with your health care provider.   Document Released: 09/01/2004 Document Revised: 06/29/2014 Document Reviewed: 02/03/2014 Elsevier Interactive Patient Education 2016 [place] An implanted port is a type of central line that is placed under the skin. Central lines are used to provide IV access when treatment or nutrition needs to be given through a person's veins. Implanted ports are used for long-term IV access. An implanted port may be placed because:   You need IV medicine that would be irritating to the small veins in your hands or arms.   You need long-term IV medicines, such as antibiotics.   You need IV nutrition for a long period.   You need frequent blood draws for lab tests.   You need dialysis.  Implanted ports are usually placed in the chest area, but they can also be placed in the upper arm, the abdomen, or the leg. An implanted port has two main parts:   Reservoir. The reservoir is round and will appear as a small,  raised area under your skin. The reservoir is the part where a needle is inserted to give medicines or draw blood.   Catheter. The catheter is a thin, flexible tube that extends from the reservoir. The catheter is placed into a large vein. Medicine that is inserted into the reservoir goes into the catheter and then into the vein.  HOW WILL I CARE FOR MY INCISION SITE? Do not get the incision site wet. Bathe or shower as directed by your health care provider.  HOW IS MY PORT ACCESSED? Special steps must be taken to access the port:   Before the port is accessed, a numbing cream can be placed on the skin. This helps numb the skin over the port site.   Your health care provider uses a sterile technique to access the port.  Your health care provider must put on a mask and sterile gloves.  The skin over your port is cleaned carefully with an antiseptic and allowed to dry.  The port is gently pinched between sterile gloves, and a needle is inserted into the port.  Only "non-coring" port needles should be used to access the port. Once the port is accessed, a blood return should  be checked. This helps ensure that the port is in the vein and is not clogged.   If your port needs to remain accessed for a constant infusion, a clear (transparent) bandage will be placed over the needle site. The bandage and needle will need to be changed every week, or as directed by your health care provider.   Keep the bandage covering the needle clean and dry. Do not get it wet. Follow your health care provider's instructions on how to take a shower or bath while the port is accessed.   If your port does not need to stay accessed, no bandage is needed over the port.  WHAT IS FLUSHING? Flushing helps keep the port from getting clogged. Follow your health care provider's instructions on how and when to flush the port. Ports are usually flushed with saline solution or a medicine called heparin. The need for  flushing will depend on how the port is used.   If the port is used for intermittent medicines or blood draws, the port will need to be flushed:   After medicines have been given.   After blood has been drawn.   As part of routine maintenance.   If a constant infusion is running, the port may not need to be flushed.  HOW LONG WILL MY PORT STAY IMPLANTED? The port can stay in for as long as your health care provider thinks it is needed. When it is time for the port to come out, surgery will be done to remove it. The procedure is similar to the one performed when the port was put in.  WHEN SHOULD I SEEK IMMEDIATE MEDICAL CARE? When you have an implanted port, you should seek immediate medical care if:   You notice a bad smell coming from the incision site.   You have swelling, redness, or drainage at the incision site.   You have more swelling or pain at the port site or the surrounding area.   You have a fever that is not controlled with medicine.   This information is not intended to replace advice given to you by your health care provider. Make sure you discuss any questions you have with your health care provider.   Document Released: 02/12/2005 Document Revised: 12/03/2012 Document Reviewed: 10/20/2012 Elsevier Interactive Patient Education 2016 [place] Insertion, Care After Refer to this sheet in the next few weeks. These instructions provide you with information on caring for yourself after your procedure. Your health care provider may also give you more specific instructions. Your treatment has been planned according to current medical practices, but problems sometimes occur. Call your health care provider if you have any problems or questions after your procedure. WHAT TO EXPECT AFTER THE PROCEDURE After your procedure, it is typical to have the following:   Discomfort at the port insertion site. Ice packs to the area will help.  Bruising on the  skin over the port. This will subside in 3-4 days. HOME CARE INSTRUCTIONS  After your port is placed, you will get a manufacturer's information card. The card has information about your port. Keep this card with you at all times.   Know what kind of port you have. There are many types of ports available.   Wear a medical alert bracelet in case of an emergency. This can help alert health care workers that you have a port.   The port can stay in for as long as your health care provider believes it is  necessary.   A home health care nurse may give medicines and take care of the port.   You or a family member can get special training and directions for giving medicine and taking care of the port at home.  SEEK MEDICAL CARE IF:   Your port does not flush or you are unable to get a blood return.   You have a fever or chills. SEEK IMMEDIATE MEDICAL CARE IF:  You have new fluid or pus coming from your incision.   You notice a bad smell coming from your incision site.   You have swelling, pain, or more redness at the incision or port site.   You have chest pain or shortness of breath.   This information is not intended to replace advice given to you by your health care provider. Make sure you discuss any questions you have with your health care provider.   Document Released: 12/03/2012 Document Revised: 02/17/2013 Document Reviewed: 12/03/2012 Elsevier Interactive Patient Education 2016 Elsevier Inc. Moderate Conscious Sedation, Adult Sedation is the use of medicines to promote relaxation and relieve discomfort and anxiety. Moderate conscious sedation is a type of sedation. Under moderate conscious sedation you are less alert than normal but are still able to respond to instructions or stimulation. Moderate conscious sedation is used during short medical and dental procedures. It is milder than deep sedation or general anesthesia and allows you to return to your regular activities  sooner. LET [facility] CARE PROVIDER KNOW ABOUT:   Any allergies you have.  All medicines you are taking, including vitamins, herbs, eye drops, creams, and over-the-counter medicines.  Use of steroids (by mouth or creams).  Previous problems you or members of your family have had with the use of anesthetics.  Any blood disorders you have.  Previous surgeries you have had.  Medical conditions you have.  Possibility of pregnancy, if this applies.  Use of cigarettes, alcohol, or illegal drugs. RISKS AND COMPLICATIONS Generally, this is a safe procedure. However, as with any procedure, problems can occur. Possible problems include:  Oversedation.  Trouble breathing on your own. You may need to have a breathing tube until you are awake and breathing on your own.  Allergic reaction to any of the medicines used for the procedure. BEFORE THE PROCEDURE  You may have blood tests done. These tests can help show how well your kidneys and liver are working. They can also show how well your blood clots.  A physical exam will be done.  Only take medicines as directed by your health care provider. You may need to stop taking medicines (such as blood thinners, aspirin, or nonsteroidal anti-inflammatory drugs) before the procedure.   Do not eat or drink at least 6 hours before the procedure or as directed by your health care provider.  Arrange for a responsible adult, family member, or friend to take you home after the procedure. He or she should stay with you for at least 24 hours after the procedure, until the medicine has worn off. PROCEDURE   An intravenous (IV) catheter will be inserted into one of your veins. Medicine will be able to flow directly into your body through this catheter. You may be given medicine through this tube to help prevent pain and help you relax.  The medical or dental procedure will be done. AFTER THE PROCEDURE  You will stay in a recovery area until the  medicine has worn off. Your blood pressure and pulse will be checked.  Depending on the procedure you had, you may be allowed to go home when you can tolerate liquids and your pain is under control.   This information is not intended to replace advice given to you by your health care provider. Make sure you discuss any questions you have with your health care provider.   Document Released: 11/07/2000 Document Revised: 03/05/2014 Document Reviewed: 10/20/2012 Elsevier Interactive Patient Education [employer].

## 2015-08-23 ENCOUNTER — Encounter: Payer: Self-pay | Admitting: *Deleted

## 2015-08-23 ENCOUNTER — Encounter: Payer: Self-pay | Admitting: Hematology and Oncology

## 2015-08-23 ENCOUNTER — Ambulatory Visit: Payer: No Typology Code available for payment source | Admitting: Hematology and Oncology

## 2015-08-23 ENCOUNTER — Other Ambulatory Visit: Payer: No Typology Code available for payment source

## 2015-08-23 ENCOUNTER — Ambulatory Visit (HOSPITAL_COMMUNITY)
Admission: RE | Admit: 2015-08-23 | Discharge: 2015-08-23 | Disposition: A | Discharge status: Home / Self Care | Payer: No Typology Code available for payment source | Source: Ambulatory Visit | Attending: Hematology and Oncology | Admitting: Hematology and Oncology

## 2015-08-23 DIAGNOSIS — Z01810 Encounter for preprocedural cardiovascular examination: Secondary | ICD-10-CM | POA: Diagnosis not present

## 2015-08-23 DIAGNOSIS — C8174 Other classical Hodgkin lymphoma, lymph nodes of axilla and upper limb: Secondary | ICD-10-CM | POA: Insufficient documentation

## 2015-08-23 DIAGNOSIS — Z720 Tobacco use: Secondary | ICD-10-CM | POA: Diagnosis not present

## 2015-08-23 LAB — ECHOCARDIOGRAM COMPLETE
CHL CUP MV DEC (S): 215
CHL CUP STROKE VOLUME: 55 mL
EERAT: 5.61
EWDT: 215 ms
FS: 33 % (ref 28–44)
IVS/LV PW RATIO, ED: 1.01
LA ID, A-P, ES: 27 mm
LA diam end sys: 27 mm
LA vol A4C: 28.9 ml
LADIAMINDEX: 1.44 cm/m2
LV PW d: 7.75 mm — AB (ref 0.6–1.1)
LV SIMPSON'S DISK: 60
LV TDI E'MEDIAL: 15.1
LV e' LATERAL: 18 cm/s
LV sys vol index: 19 mL/m2
LV sys vol: 36 mL (ref 21–61)
LVDIAVOL: 91 mL (ref 62–150)
LVDIAVOLIN: 48 mL/m2
LVEEAVG: 5.61
LVEEMED: 5.61
LVOT area: 3.14 cm2
LVOTD: 20 mm
MV pk A vel: 67.9 m/s
MV pk E vel: 101 m/s
MVPG: 4 mmHg
TDI e' lateral: 18

## 2015-08-23 NOTE — Progress Notes (Signed)
Pt would like to apply to [employer] so I did the application online.  Waiting for approval.  Pt also applied for the [place] which he is approved.  Gave him an expense sheet.  I will contact pt once I hear from [employer].

## 2015-08-23 NOTE — Progress Notes (Signed)
  Echocardiogram 2D Echocardiogram has been performed.  [name] 08/23/2015, 12:46 PM

## 2015-08-24 ENCOUNTER — Other Ambulatory Visit: Payer: Self-pay | Admitting: Hematology and Oncology

## 2015-08-24 ENCOUNTER — Ambulatory Visit (HOSPITAL_COMMUNITY)
Admission: RE | Admit: 2015-08-24 | Discharge: 2015-08-24 | Disposition: A | Discharge status: Home / Self Care | Payer: No Typology Code available for payment source | Source: Ambulatory Visit | Attending: Hematology and Oncology | Admitting: Hematology and Oncology

## 2015-08-24 DIAGNOSIS — C8174 Other classical Hodgkin lymphoma, lymph nodes of axilla and upper limb: Secondary | ICD-10-CM

## 2015-08-24 DIAGNOSIS — Z720 Tobacco use: Secondary | ICD-10-CM | POA: Diagnosis not present

## 2015-08-24 DIAGNOSIS — C8118 Nodular sclerosis classical Hodgkin lymphoma, lymph nodes of multiple sites: Secondary | ICD-10-CM

## 2015-08-24 LAB — PULMONARY FUNCTION TEST
DL/VA % PRED: 113 %
DL/VA: 5.44 ml/min/mmHg/L
DLCO COR % PRED: 101 %
DLCO UNC % PRED: 105 %
DLCO cor: 35.4 ml/min/mmHg
DLCO unc: 36.81 ml/min/mmHg
FEF 25-75 Post: 4.07 L/sec
FEF 25-75 Pre: 3.25 L/sec
FEF2575-%CHANGE-POST: 24 %
FEF2575-%PRED-POST: 88 %
FEF2575-%Pred-Pre: 71 %
FEV1-%CHANGE-POST: 10 %
FEV1-%Pred-Post: 88 %
FEV1-%Pred-Pre: 80 %
FEV1-PRE: 3.75 L
FEV1-Post: 4.13 L
FEV1FVC-%CHANGE-POST: 9 %
FEV1FVC-%PRED-PRE: 90 %
FEV6-%Change-Post: 1 %
FEV6-%PRED-POST: 89 %
FEV6-%Pred-Pre: 88 %
FEV6-PRE: 5.03 L
FEV6-Post: 5.08 L
FEV6FVC-%PRED-PRE: 102 %
FEV6FVC-%Pred-Post: 102 %
FVC-%CHANGE-POST: 1 %
FVC-%PRED-PRE: 86 %
FVC-%Pred-Post: 87 %
FVC-POST: 5.08 L
FVC-PRE: 5.03 L
POST FEV1/FVC RATIO: 81 %
PRE FEV6/FVC RATIO: 100 %
Post FEV6/FVC ratio: 100 %
Pre FEV1/FVC ratio: 75 %
RV % PRED: 113 %
RV: 2 L
TLC % pred: 94 %
TLC: 6.87 L

## 2015-08-24 LAB — GLUCOSE, CAPILLARY: GLUCOSE-CAPILLARY: 98 mg/dL (ref 65–99)

## 2015-08-24 MED ORDER — FLUDEOXYGLUCOSE F - 18 (FDG) INJECTION
7.1000 | Freq: Once | INTRAVENOUS | Status: AC | PRN
  Administered 2015-08-24: 7.1 via INTRAVENOUS

## 2015-08-24 MED ORDER — DEXAMETHASONE 4 MG PO TABS: ORAL_TABLET | ORAL | Status: DC

## 2015-08-24 MED ORDER — PROCHLORPERAZINE MALEATE 10 MG PO TABS: 10.0000 mg | ORAL_TABLET | Freq: Four times a day (QID) | ORAL | Status: DC | PRN

## 2015-08-24 MED ORDER — ONDANSETRON HCL 8 MG PO TABS: 8.0000 mg | ORAL_TABLET | Freq: Two times a day (BID) | ORAL | Status: DC | PRN

## 2015-08-24 MED ORDER — LIDOCAINE-PRILOCAINE 2.5-2.5 % EX CREA: TOPICAL_CREAM | CUTANEOUS | Status: DC

## 2015-08-24 MED ORDER — ALBUTEROL SULFATE (2.5 MG/3ML) 0.083% IN NEBU
2.5000 mg | INHALATION_SOLUTION | Freq: Once | RESPIRATORY_TRACT | Status: AC
Start: 2015-08-24 — End: 2015-08-24
  Administered 2015-08-24: 2.5 mg via RESPIRATORY_TRACT

## 2015-08-24 MED FILL — PROCHLORPERAZINE 10 MG TAB: 10 | 8 days supply | Qty: 30 | Fill #0

## 2015-08-24 MED FILL — DEXAMETHASONE 4 MG TABLET: 4 | 30 days supply | Qty: 30 | Fill #0

## 2015-08-24 MED FILL — ONDANSETRON HCL 8 MG TABLET: 8 | 15 days supply | Qty: 30 | Fill #0

## 2015-08-24 MED FILL — LIDOCAINE-PRILOCAINE CREAM: 2.5-2.5 | 30 days supply | Qty: 30 | Fill #0

## 2015-08-25 ENCOUNTER — Encounter: Payer: Self-pay | Admitting: *Deleted

## 2015-08-25 ENCOUNTER — Ambulatory Visit (HOSPITAL_BASED_OUTPATIENT_CLINIC_OR_DEPARTMENT_OTHER): Payer: No Typology Code available for payment source

## 2015-08-25 ENCOUNTER — Ambulatory Visit: Payer: No Typology Code available for payment source

## 2015-08-25 ENCOUNTER — Ambulatory Visit (HOSPITAL_BASED_OUTPATIENT_CLINIC_OR_DEPARTMENT_OTHER): Payer: No Typology Code available for payment source | Admitting: Hematology and Oncology

## 2015-08-25 ENCOUNTER — Telehealth: Payer: Self-pay | Admitting: Hematology and Oncology

## 2015-08-25 ENCOUNTER — Encounter: Payer: Self-pay | Admitting: Hematology and Oncology

## 2015-08-25 VITALS — BP 117/67 | HR 67 | Temp 98.4°F | Resp 18 | Ht 73.0 in | Wt 144.8 lb

## 2015-08-25 DIAGNOSIS — C8118 Nodular sclerosis classical Hodgkin lymphoma, lymph nodes of multiple sites: Secondary | ICD-10-CM

## 2015-08-25 DIAGNOSIS — Z5111 Encounter for antineoplastic chemotherapy: Secondary | ICD-10-CM

## 2015-08-25 DIAGNOSIS — Z720 Tobacco use: Secondary | ICD-10-CM | POA: Diagnosis not present

## 2015-08-25 LAB — COMPREHENSIVE METABOLIC PANEL
ALT: 10 U/L (ref 0–55)
ANION GAP: 9 meq/L (ref 3–11)
AST: 15 U/L (ref 5–34)
Albumin: 3.9 g/dL (ref 3.5–5.0)
Alkaline Phosphatase: 71 U/L (ref 40–150)
BILIRUBIN TOTAL: 0.33 mg/dL (ref 0.20–1.20)
BUN: 7.2 mg/dL (ref 7.0–26.0)
CHLORIDE: 106 meq/L (ref 98–109)
CO2: 26 meq/L (ref 22–29)
Calcium: 9.2 mg/dL (ref 8.4–10.4)
Creatinine: 0.8 mg/dL (ref 0.7–1.3)
Glucose: 90 mg/dl (ref 70–140)
POTASSIUM: 4 meq/L (ref 3.5–5.1)
Sodium: 140 mEq/L (ref 136–145)
TOTAL PROTEIN: 6.9 g/dL (ref 6.4–8.3)

## 2015-08-25 MED ORDER — PALONOSETRON HCL INJECTION 0.25 MG/5ML
0.2500 mg | Freq: Once | INTRAVENOUS | Status: AC
Start: 2015-08-25 — End: 2015-08-25
  Administered 2015-08-25: 0.25 mg via INTRAVENOUS

## 2015-08-25 MED ORDER — VINBLASTINE SULFATE CHEMO INJECTION 1 MG/ML
5.9500 mg/m2 | Freq: Once | INTRAVENOUS | Status: AC
  Administered 2015-08-25: 11 mg via INTRAVENOUS
  Filled 2015-08-25: qty 11

## 2015-08-25 MED ORDER — HEPARIN SOD (PORK) LOCK FLUSH 100 UNIT/ML IV SOLN
500.0000 [IU] | Freq: Once | INTRAVENOUS | Status: AC | PRN
  Administered 2015-08-25: 500 [IU]
  Filled 2015-08-25: qty 5

## 2015-08-25 MED ORDER — SODIUM CHLORIDE 0.9 % IV SOLN
Freq: Once | INTRAVENOUS | Status: AC
  Administered 2015-08-25: 10:00:00 via INTRAVENOUS

## 2015-08-25 MED ORDER — DOXORUBICIN HCL CHEMO IV INJECTION 2 MG/ML
25.0000 mg/m2 | Freq: Once | INTRAVENOUS | Status: AC
  Administered 2015-08-25: 46 mg via INTRAVENOUS
  Filled 2015-08-25: qty 23

## 2015-08-25 MED ORDER — SODIUM CHLORIDE 0.9% FLUSH
10.0000 mL | INTRAVENOUS | Status: DC | PRN
  Administered 2015-08-25: 10 mL
  Filled 2015-08-25: qty 10

## 2015-08-25 MED ORDER — SODIUM CHLORIDE 0.9 % IV SOLN
375.0000 mg/m2 | Freq: Once | INTRAVENOUS | Status: AC
  Administered 2015-08-25: 700 mg via INTRAVENOUS
  Filled 2015-08-25: qty 35

## 2015-08-25 MED ORDER — SODIUM CHLORIDE 0.9 % IV SOLN
Freq: Once | INTRAVENOUS | Status: AC
  Administered 2015-08-25: 10:00:00 via INTRAVENOUS
  Filled 2015-08-25: qty 5

## 2015-08-25 MED ORDER — PALONOSETRON HCL INJECTION 0.25 MG/5ML
INTRAVENOUS | Status: AC
  Filled 2015-08-25: qty 5

## 2015-08-25 MED ORDER — SODIUM CHLORIDE 0.9 % IV SOLN
10.0000 [IU]/m2 | Freq: Once | INTRAVENOUS | Status: AC
  Administered 2015-08-25: 19 [IU] via INTRAVENOUS
  Filled 2015-08-25: qty 0.03

## 2015-08-25 NOTE — Progress Notes (Signed)
[place] Work  Clinical Social Work received phone call from patient's spouse requesting information on how to apply for disability. Clinical Social Worker met with patient and spouse during first infusion today to offer support and assess for needs.  Mr. Brine has decided not apply for social security disability at this time, because he is hopeful his cancer will not be a permanent disability.  CSW provided patient with application for [employer] [place].  Patient has three children ages 48, 24, and 46.  CSW provided brief support and encouraged patient/spouse to follow up with CSW as needed.     Polo Riley, MSW, LCSW, OSW-C Clinical Social Worker [facility] [phone]

## 2015-08-25 NOTE — Progress Notes (Signed)
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   07/28/2015 Pathology Results Accession: [id number] core needle biopsy of the right supraclavicular region came back positive for classical Hodgkin lymphoma   08/22/2015 Bone Marrow Biopsy Accession: [id number] Bone marrow is negative for involvement   08/22/2015 Procedure He has port placement and bone marrow biopsy   08/23/2015 Imaging ECHO showed normal EF   08/24/2015 PET scan Bilateral hypermetabolic supraclavicular lymphadenopathy and mediastinal adenopathy consistent with known Hodgkin's lymphoma. 2. No disease is identified below the diaphragms.    INTERVAL HISTORY: Please see below for problem oriented charting. He returns to review test results and to start cycle 1 of treatment. He is attempting to quit smoking. He denies new symptoms since I saw him.  REVIEW OF SYSTEMS:   Constitutional: Denies fevers, chills or abnormal weight loss Eyes: Denies blurriness of vision Ears, nose, mouth, throat, and face: Denies mucositis or sore throat Respiratory: Denies cough, dyspnea or wheezes Cardiovascular: Denies palpitation, chest discomfort or lower extremity swelling Gastrointestinal:  Denies nausea, heartburn or change in bowel habits Skin: Denies abnormal skin rashes Lymphatics: Denies new lymphadenopathy or easy bruising Neurological:Denies numbness, tingling or new  weaknesses Behavioral/Psych: Mood is stable, no new changes  All other systems were reviewed with the patient and are negative.  I have reviewed the past medical history, past surgical history, social history and family history with the patient and they are unchanged from previous note.  ALLERGIES:  has No Known Allergies.  MEDICATIONS:  Current Outpatient Prescriptions  Medication Sig Dispense Refill  . albuterol (PROVENTIL HFA;VENTOLIN HFA) 108 (90 Base) MCG/ACT inhaler Inhale 2 puffs into the lungs every 6 (six) hours as needed for wheezing or shortness of breath. 1 Inhaler 0  . aspirin EC 81 MG tablet Take 81 mg by mouth daily. To take 162 mg daily    . dexamethasone (DECADRON) 4 MG tablet Take 2 tablets by mouth once a day on the day after chemotherapy and then take 2 tablets two times a day for 2 days. Take with food. 30 tablet 1  . ibuprofen (ADVIL,MOTRIN) 200 MG tablet Take 200 mg by mouth every 6 (six) hours as needed.    . lidocaine-prilocaine (EMLA) cream Apply to affected area once 30 g 3  . naproxen sodium (ANAPROX) 220 MG tablet Take 220 mg by mouth 2 (two) times daily with a meal. As needed for back pain    . ondansetron (ZOFRAN) 8 MG tablet Take 1 tablet (8 mg total) by mouth 2 (two) times daily as needed. Start on the third day after chemotherapy. 30 tablet 1  . prochlorperazine (COMPAZINE) 10 MG tablet Take 1 tablet (10 mg total) by mouth every 6 (six) hours as needed (Nausea or vomiting). 30 tablet 1   No current facility-administered medications for this visit.    PHYSICAL EXAMINATION: ECOG PERFORMANCE STATUS: 0 - Asymptomatic  Filed Vitals:   08/25/15 0943  BP: 117/67  Pulse: 67  Temp: 98.4 F (36.9 C)  Resp: 18   Filed Weights  08/25/15 0943  Weight: 144 lb 12.8 oz (65.681 kg)    GENERAL:alert, no distress and comfortable SKIN: skin color, texture, turgor are normal, no rashes or significant lesions EYES: normal, Conjunctiva are pink and non-injected,  sclera clear Musculoskeletal:no cyanosis of digits and no clubbing  NEURO: alert & oriented x 3 with fluent speech, no focal motor/sensory deficits  LABORATORY DATA:  I have reviewed the data as listed    Component Value Date/Time   NA 140 08/25/2015 0947   NA 143 07/11/2015 1630   K 4.0 08/25/2015 0947   K 3.9 07/11/2015 1630   CL 100 07/11/2015 1630   CO2 26 08/25/2015 0947   CO2 25 07/11/2015 1630   GLUCOSE 90 08/25/2015 0947   GLUCOSE 100* 07/11/2015 1630   BUN 7.2 08/25/2015 0947   BUN 11 07/11/2015 1630   CREATININE 0.8 08/25/2015 0947   CREATININE 0.91 07/11/2015 1630   CALCIUM 9.2 08/25/2015 0947   CALCIUM 9.2 07/11/2015 1630   PROT 6.9 08/25/2015 0947   PROT 7.3 07/11/2015 1630   ALBUMIN 3.9 08/25/2015 0947   ALBUMIN 5.1 07/11/2015 1630   AST 15 08/25/2015 0947   AST 18 07/11/2015 1630   ALT 10 08/25/2015 0947   ALT 13 07/11/2015 1630   ALKPHOS 71 08/25/2015 0947   ALKPHOS 81 07/11/2015 1630   BILITOT 0.33 08/25/2015 0947   BILITOT 0.3 07/11/2015 1630   GFRNONAA 112 07/11/2015 1630   GFRAA 129 07/11/2015 1630    No results found for: SPEP, UPEP  Lab Results  Component Value Date   WBC 7.6 08/22/2015   NEUTROABS 3.5 07/11/2015   HGB 16.1 08/22/2015   HCT 43.7 08/22/2015   MCV 85.9 08/22/2015   PLT 232 08/22/2015      Chemistry      Component Value Date/Time   NA 140 08/25/2015 0947   NA 143 07/11/2015 1630   K 4.0 08/25/2015 0947   K 3.9 07/11/2015 1630   CL 100 07/11/2015 1630   CO2 26 08/25/2015 0947   CO2 25 07/11/2015 1630   BUN 7.2 08/25/2015 0947   BUN 11 07/11/2015 1630   CREATININE 0.8 08/25/2015 0947   CREATININE 0.91 07/11/2015 1630      Component Value Date/Time   CALCIUM 9.2 08/25/2015 0947   CALCIUM 9.2 07/11/2015 1630   ALKPHOS 71 08/25/2015 0947   ALKPHOS 81 07/11/2015 1630   AST 15 08/25/2015 0947   AST 18 07/11/2015 1630   ALT 10 08/25/2015 0947   ALT 13 07/11/2015 1630   BILITOT 0.33 08/25/2015 0947   BILITOT 0.3  07/11/2015 1630       RADIOGRAPHIC STUDIES: I have personally reviewed the radiological images as listed and agreed with the findings in the report. Nm Pet Image Initial (pi) Skull Base To Thigh  08/24/2015  CLINICAL DATA:  Initial treatment strategy for Hodgkin's lymphoma. EXAM: NUCLEAR MEDICINE PET SKULL BASE TO THIGH TECHNIQUE: 7.1 mCi F-18 FDG was injected intravenously. Full-ring PET imaging was performed from the skull base to thigh after the radiotracer. CT data was obtained and used for attenuation correction and anatomic localization. FASTING BLOOD GLUCOSE:  Value: 98 mg/dl COMPARISON:  Neck CT 07/21/2015 FINDINGS: NECK Extensive supraclavicular lymphadenopathy as demonstrated on the recent neck CTA. Index right supraclavicular node on image 51 measures 36 x 21 mm and is hypermetabolic with SUV max of 09.6. There are also small left supraclavicular nodes, the largest measuring 12 mm on image number 50. These demonstrate hypermetabolism with SUV  max of 7.3. There are numerous areas of hypermetabolic/brown fat. These appear fairly symmetric bilaterally. No axillary lymphadenopathy. No upper neck adenopathy. CHEST Enlarged hypermetabolic lymph nodes in the anterior mediastinum, prevascular space and right paratracheal space. Adjacent index nodes in the anterior mediastinum on image 69 measured 21.5 x 21.5 mm and 15.5 x 15.5 mm. These are hypermetabolic with SUV max of 38.1. No hilar or subcarinal adenopathy. No significant findings in the lungs. There are few scattered subpleural nodules which could be small lymph nodes. The right Port-A-Cath tip is in the right atrium. ABDOMEN/PELVIS No abnormal hypermetabolic activity within the liver, pancreas, adrenal glands, or spleen. No hypermetabolic lymph nodes in the abdomen or pelvis. SKELETON No focal hypermetabolic activity to suggest skeletal metastasis. IMPRESSION: 1. Bilateral hypermetabolic supraclavicular lymphadenopathy and mediastinal adenopathy  consistent with known Hodgkin's lymphoma. 2. No disease is identified below the diaphragms. 3. No findings to suggest osseous involvement. 4. Numerous areas of hypermetabolic/brown fat. Electronically Signed   By: Marijo Sanes M.D.   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 The patient is at stage II disease. I discussed the risks, benefit, side effects of chemotherapy using Adriamycin, bleomycin, vinblastine and dacarbazine.  The goal of therapy is of curative intent. Some of the risks that we discussed included risk of congestive heart failure, pulmonary toxicity, nausea, vomiting, hair loss, pancytopenia, risk of infection, peripheral neuropathy, etc. and he agreed to proceed. I recommend minimum 2 cycles of treatment before ordering PET CT scan to assess response to treatment.   Tobacco abuse I spent some time counseling the patient the importance of tobacco cessation. he is currently attempting to quit on his own    No orders of the defined types were placed in this encounter.   All questions were answered. The patient knows to call the clinic with any problems, questions or concerns. No barriers to learning was detected. I spent 25 minutes counseling the patient face to face. The total time spent in the appointment was 30 minutes and more than 50% was on counseling and review of test results     [facility], [place], MD 08/25/2015 5:47 PM

## 2015-08-25 NOTE — Patient Instructions (Signed)
[facility] Discharge Instructions for Patients Receiving Chemotherapy  Today you received the following chemotherapy agents Adriamycin, bleomycin, dacarbazine, velban  To help prevent nausea and vomiting after your treatment, we encourage you to take your nausea medication as directed.   If you develop nausea and vomiting that is not controlled by your nausea medication, call the clinic.   BELOW ARE SYMPTOMS THAT SHOULD BE REPORTED IMMEDIATELY:  *FEVER GREATER THAN 100.5 F  *CHILLS WITH OR WITHOUT FEVER  NAUSEA AND VOMITING THAT IS NOT CONTROLLED WITH YOUR NAUSEA MEDICATION  *UNUSUAL SHORTNESS OF BREATH  *UNUSUAL BRUISING OR BLEEDING  TENDERNESS IN MOUTH AND THROAT WITH OR WITHOUT PRESENCE OF ULCERS  *URINARY PROBLEMS  *BOWEL PROBLEMS  UNUSUAL RASH Items with * indicate a potential emergency and should be followed up as soon as possible.  Feel free to call the clinic you have any questions or concerns. The clinic phone number is [phone].  Please show the [place] at check-in to the Emergency Department and triage nurse.

## 2015-08-25 NOTE — Assessment & Plan Note (Signed)
I reviewed test results of echocardiogram, pulmonary function tests, PET CT scan and results of bone marrow aspirate and biopsy. The patient is at stage II disease. I discussed the risks, benefit, side effects of chemotherapy using Adriamycin, bleomycin, vinblastine and dacarbazine.  The goal of therapy is of curative intent. Some of the risks that we discussed included risk of congestive heart failure, pulmonary toxicity, nausea, vomiting, hair loss, pancytopenia, risk of infection, peripheral neuropathy, etc. and he agreed to proceed. I recommend minimum 2 cycles of treatment before ordering PET CT scan to assess response to treatment.

## 2015-08-25 NOTE — Assessment & Plan Note (Signed)
I spent some time counseling the patient the importance of tobacco cessation. he is currently attempting to quit on his own 

## 2015-08-25 NOTE — Telephone Encounter (Signed)
per Dr Alvy Bimler to add pt to lab-pt aware-here

## 2015-09-05 LAB — CHROMOSOME ANALYSIS, BONE MARROW

## 2015-09-08 ENCOUNTER — Other Ambulatory Visit: Payer: Self-pay | Admitting: Hematology and Oncology

## 2015-09-08 ENCOUNTER — Ambulatory Visit (HOSPITAL_BASED_OUTPATIENT_CLINIC_OR_DEPARTMENT_OTHER): Payer: No Typology Code available for payment source

## 2015-09-08 ENCOUNTER — Other Ambulatory Visit (HOSPITAL_BASED_OUTPATIENT_CLINIC_OR_DEPARTMENT_OTHER): Payer: No Typology Code available for payment source

## 2015-09-08 VITALS — BP 121/63 | HR 78 | Temp 98.8°F | Resp 16

## 2015-09-08 DIAGNOSIS — C8118 Nodular sclerosis classical Hodgkin lymphoma, lymph nodes of multiple sites: Secondary | ICD-10-CM

## 2015-09-08 DIAGNOSIS — Z5111 Encounter for antineoplastic chemotherapy: Secondary | ICD-10-CM | POA: Diagnosis not present

## 2015-09-08 DIAGNOSIS — C8174 Other classical Hodgkin lymphoma, lymph nodes of axilla and upper limb: Secondary | ICD-10-CM

## 2015-09-08 LAB — CBC WITH DIFFERENTIAL/PLATELET
BASO%: 1 % (ref 0.0–2.0)
Basophils Absolute: 0 10*3/uL (ref 0.0–0.1)
EOS%: 9.2 % — AB (ref 0.0–7.0)
Eosinophils Absolute: 0.3 10*3/uL (ref 0.0–0.5)
HCT: 41.2 % (ref 38.4–49.9)
HEMOGLOBIN: 14.5 g/dL (ref 13.0–17.1)
LYMPH%: 33 % (ref 14.0–49.0)
MCH: 31.3 pg (ref 27.2–33.4)
MCHC: 35.2 g/dL (ref 32.0–36.0)
MCV: 88.8 fL (ref 79.3–98.0)
MONO#: 0.7 10*3/uL (ref 0.1–0.9)
MONO%: 22.5 % — AB (ref 0.0–14.0)
NEUT%: 34.3 % — ABNORMAL LOW (ref 39.0–75.0)
NEUTROS ABS: 1.1 10*3/uL — AB (ref 1.5–6.5)
Platelets: 198 10*3/uL (ref 140–400)
RBC: 4.64 10*6/uL (ref 4.20–5.82)
RDW: 13.2 % (ref 11.0–14.6)
WBC: 3.1 10*3/uL — ABNORMAL LOW (ref 4.0–10.3)
lymph#: 1 10*3/uL (ref 0.9–3.3)

## 2015-09-08 LAB — COMPREHENSIVE METABOLIC PANEL
ALBUMIN: 4 g/dL (ref 3.5–5.0)
ALK PHOS: 62 U/L (ref 40–150)
ALT: 53 U/L (ref 0–55)
AST: 31 U/L (ref 5–34)
Anion Gap: 11 mEq/L (ref 3–11)
BUN: 8.8 mg/dL (ref 7.0–26.0)
CO2: 25 mEq/L (ref 22–29)
Calcium: 9 mg/dL (ref 8.4–10.4)
Chloride: 107 mEq/L (ref 98–109)
Creatinine: 0.8 mg/dL (ref 0.7–1.3)
EGFR: >90 mL/min/{1.73_m2} (ref >90)
GLUCOSE: 83 mg/dL (ref 70–140)
POTASSIUM: 4.1 meq/L (ref 3.5–5.1)
SODIUM: 143 meq/L (ref 136–145)
TOTAL PROTEIN: 6.8 g/dL (ref 6.4–8.3)
Total Bilirubin: 0.34 mg/dL (ref 0.20–1.20)

## 2015-09-08 LAB — URIC ACID: Uric Acid, Serum: 4.8 mg/dl (ref 2.6–7.4)

## 2015-09-08 LAB — LACTATE DEHYDROGENASE: LDH: 220 U/L (ref 125–245)

## 2015-09-08 MED ORDER — DOXORUBICIN HCL CHEMO IV INJECTION 2 MG/ML
25.0000 mg/m2 | Freq: Once | INTRAVENOUS | Status: AC
  Administered 2015-09-08: 46 mg via INTRAVENOUS
  Filled 2015-09-08: qty 23

## 2015-09-08 MED ORDER — SODIUM CHLORIDE 0.9 % IV SOLN
Freq: Once | INTRAVENOUS | Status: AC
  Administered 2015-09-08: 11:00:00 via INTRAVENOUS
  Filled 2015-09-08: qty 5

## 2015-09-08 MED ORDER — PALONOSETRON HCL INJECTION 0.25 MG/5ML
0.2500 mg | Freq: Once | INTRAVENOUS | Status: AC
Start: 2015-09-08 — End: 2015-09-08
  Administered 2015-09-08: 0.25 mg via INTRAVENOUS

## 2015-09-08 MED ORDER — HEPARIN SOD (PORK) LOCK FLUSH 100 UNIT/ML IV SOLN
500.0000 [IU] | Freq: Once | INTRAVENOUS | Status: AC | PRN
  Administered 2015-09-08: 500 [IU]
  Filled 2015-09-08: qty 5

## 2015-09-08 MED ORDER — VINBLASTINE SULFATE CHEMO INJECTION 1 MG/ML: 6.0000 mg/m2 | Freq: Once | INTRAVENOUS | Status: DC

## 2015-09-08 MED ORDER — SODIUM CHLORIDE 0.9 % IV SOLN
Freq: Once | INTRAVENOUS | Status: AC
  Administered 2015-09-08: 11:00:00 via INTRAVENOUS

## 2015-09-08 MED ORDER — SODIUM CHLORIDE 0.9 % IV SOLN
11.0000 mg | Freq: Once | INTRAVENOUS | Status: AC
  Administered 2015-09-08: 11 mg via INTRAVENOUS
  Filled 2015-09-08: qty 11

## 2015-09-08 MED ORDER — PALONOSETRON HCL INJECTION 0.25 MG/5ML
INTRAVENOUS | Status: AC
  Filled 2015-09-08: qty 5

## 2015-09-08 MED ORDER — SODIUM CHLORIDE 0.9 % IV SOLN
10.0000 [IU]/m2 | Freq: Once | INTRAVENOUS | Status: AC
Start: 2015-09-08 — End: 2015-09-08
  Administered 2015-09-08: 19 [IU] via INTRAVENOUS
  Filled 2015-09-08: qty 6.33

## 2015-09-08 MED ORDER — SODIUM CHLORIDE 0.9% FLUSH
10.0000 mL | INTRAVENOUS | Status: DC | PRN
  Administered 2015-09-08: 10 mL
  Filled 2015-09-08: qty 10

## 2015-09-08 MED ORDER — SODIUM CHLORIDE 0.9 % IV SOLN
375.0000 mg/m2 | Freq: Once | INTRAVENOUS | Status: AC
  Administered 2015-09-08: 700 mg via INTRAVENOUS
  Filled 2015-09-08: qty 35

## 2015-09-08 NOTE — Patient Instructions (Signed)
[facility] Discharge Instructions for Patients Receiving Chemotherapy  Today you received the following chemotherapy agents:  Adriamycin, Vinblastine, Bleomycin, and DTIC.  To help prevent nausea and vomiting after your treatment, we encourage you to take your nausea medication as directed.   If you develop nausea and vomiting that is not controlled by your nausea medication, call the clinic.   BELOW ARE SYMPTOMS THAT SHOULD BE REPORTED IMMEDIATELY:  *FEVER GREATER THAN 100.5 F  *CHILLS WITH OR WITHOUT FEVER  NAUSEA AND VOMITING THAT IS NOT CONTROLLED WITH YOUR NAUSEA MEDICATION  *UNUSUAL SHORTNESS OF BREATH  *UNUSUAL BRUISING OR BLEEDING  TENDERNESS IN MOUTH AND THROAT WITH OR WITHOUT PRESENCE OF ULCERS  *URINARY PROBLEMS  *BOWEL PROBLEMS  UNUSUAL RASH Items with * indicate a potential emergency and should be followed up as soon as possible.  Feel free to call the clinic you have any questions or concerns. The clinic phone number is [phone].  Please show the [place] at check-in to the Emergency Department and triage nurse.

## 2015-09-09 LAB — SEDIMENTATION RATE: SED RATE: 2 mm/h (ref 0–15)

## 2015-09-14 ENCOUNTER — Encounter: Payer: Self-pay | Admitting: Hematology and Oncology

## 2015-09-14 NOTE — Progress Notes (Signed)
Pt is approved w/ LLS to receive assistance up to $2,500 to use for ins premiums and/or eligible pharmaceutical copays prescribed in relation to his Dx effective 08/27/15 to 09/24/16. Will send copy of approval letter & POE to [place] in the billing dept & to HIM to scan in pt's chart.

## 2015-09-18 ENCOUNTER — Encounter (HOSPITAL_COMMUNITY): Payer: Self-pay

## 2015-09-18 ENCOUNTER — Emergency Department (HOSPITAL_COMMUNITY)
Admission: EM | Admit: 2015-09-18 | Discharge: 2015-09-18 | Disposition: A | Discharge status: Home / Self Care | Payer: No Typology Code available for payment source | Attending: Emergency Medicine | Admitting: Emergency Medicine

## 2015-09-18 DIAGNOSIS — L03116 Cellulitis of left lower limb: Secondary | ICD-10-CM | POA: Diagnosis not present

## 2015-09-18 DIAGNOSIS — Z8571 Personal history of Hodgkin lymphoma: Secondary | ICD-10-CM | POA: Diagnosis not present

## 2015-09-18 DIAGNOSIS — Z7982 Long term (current) use of aspirin: Secondary | ICD-10-CM | POA: Diagnosis not present

## 2015-09-18 DIAGNOSIS — M79662 Pain in left lower leg: Secondary | ICD-10-CM | POA: Diagnosis present

## 2015-09-18 DIAGNOSIS — F17210 Nicotine dependence, cigarettes, uncomplicated: Secondary | ICD-10-CM | POA: Diagnosis not present

## 2015-09-18 LAB — CBC WITH DIFFERENTIAL/PLATELET
Basophils Absolute: 0 10*3/uL (ref 0.0–0.1)
Basophils Relative: 1 %
EOS ABS: 0.2 10*3/uL (ref 0.0–0.7)
EOS PCT: 4 %
HCT: 38.7 % — ABNORMAL LOW (ref 39.0–52.0)
Hemoglobin: 13.5 g/dL (ref 13.0–17.0)
LYMPHS PCT: 34 %
Lymphs Abs: 1.5 10*3/uL (ref 0.7–4.0)
MCH: 31.1 pg (ref 26.0–34.0)
MCHC: 34.9 g/dL (ref 30.0–36.0)
MCV: 89.2 fL (ref 78.0–100.0)
MONO ABS: 0.7 10*3/uL (ref 0.1–1.0)
Monocytes Relative: 17 %
NEUTROS PCT: 44 %
Neutro Abs: 2 10*3/uL (ref 1.7–7.7)
PLATELETS: 209 10*3/uL (ref 150–400)
RBC: 4.34 MIL/uL (ref 4.22–5.81)
RDW: 13.1 % (ref 11.5–15.5)
WBC: 4.4 10*3/uL (ref 4.0–10.5)

## 2015-09-18 MED ORDER — DOXYCYCLINE HYCLATE 100 MG PO TABS
100.0000 mg | ORAL_TABLET | Freq: Once | ORAL | Status: AC
  Administered 2015-09-18: 100 mg via ORAL
  Filled 2015-09-18: qty 1

## 2015-09-18 MED ORDER — DOXYCYCLINE HYCLATE 100 MG PO CAPS: 100.0000 mg | ORAL_CAPSULE | Freq: Two times a day (BID) | ORAL | 0 refills | Status: DC

## 2015-09-18 NOTE — ED Provider Notes (Signed)
[facility] DEPT Provider Note   CSN: [id number] Arrival date & time: 09/18/15  [id number]  First Provider Contact:  First MD Initiated Contact with Patient 09/18/15 1941      History   Chief Complaint Chief Complaint  Patient presents with  . CA Pt  . Groin Pain    HPI SKIP MAREZ is a 30 y.o. male.  The history is provided by the patient.  Groin Pain  This is a new problem. The current episode started more than 2 days ago. The problem occurs daily. The problem has been gradually worsening. Pertinent negatives include no chest pain and no shortness of breath. The symptoms are aggravated by walking. The symptoms are relieved by rest.  patient with h/o lymphoma and factor 5 leiden deficiency presents with 4 days of left thigh pain/redness Denies trauma to area No actual hip or genitalia pain No fever/vomiting He is concerned for DVT He is on active chemo, last treatment on 7/13 He has been otherwise, [place]   Past Medical History:  Diagnosis Date  . Allergy   . Cancer [facility])    hodgkin lymphoma  . Familial multiple factor deficiency syndrome, type IV (H[facility])    "Factor IV Leiden"    Patient Active Problem List   Diagnosis Date Noted  . Tobacco abuse 08/11/2015  . Factor V Leiden ([place]) 07/19/2015  . 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 Inguinal lymphadenopathy 07/11/2015    Past Surgical History:  Procedure Laterality Date  . TONSILLECTOMY    . tubes in ears Bilateral        Home Medications    Prior to Admission medications   Medication Sig Start Date End Date Taking? Authorizing Provider  albuterol (PROVENTIL HFA;VENTOLIN HFA) 108 (90 Base) MCG/ACT inhaler Inhale 2 puffs into the lungs every 6 (six) hours as needed for wheezing or shortness of breath. 07/12/15   Timmothy Euler, MD  aspirin EC 81 MG tablet Take 81 mg by mouth daily. To take 162 mg daily     Historical Provider, MD  dexamethasone (DECADRON) 4 MG tablet Take 2 tablets by mouth once a day on the day after chemotherapy and then take 2 tablets two times a day for 2 days. Take with food. 08/24/15   Heath Lark, MD  ibuprofen (ADVIL,MOTRIN) 200 MG tablet Take 200 mg by mouth every 6 (six) hours as needed.    Historical Provider, MD  lidocaine-prilocaine (EMLA) cream Apply to affected area once 08/24/15   Heath Lark, MD  naproxen sodium (ANAPROX) 220 MG tablet Take 220 mg by mouth 2 (two) times daily with a meal. As needed for back pain    Historical Provider, MD  ondansetron (ZOFRAN) 8 MG tablet Take 1 tablet (8 mg total) by mouth 2 (two) times daily as needed. Start on the third day after chemotherapy. 08/24/15   Heath Lark, MD  prochlorperazine (COMPAZINE) 10 MG tablet Take 1 tablet (10 mg total) by mouth every 6 (six) hours as needed (Nausea or vomiting). 08/24/15   Heath Lark, MD    Family History Family History  Problem Relation Age of Onset  . Depression Mother   . Hypertension Father   . Diabetes Maternal Grandmother   . Diabetes Maternal Grandfather   . Cancer Maternal Grandfather     lung ca  . Diabetes Paternal Grandmother   . Diabetes Paternal Grandfather     Social History  Social History  Substance Use Topics  . Smoking status: Current Every Day Smoker    Packs/day: 0.50    Years: 19.00    Types: Cigarettes  . Smokeless tobacco: Never Used  . Alcohol use Yes     Allergies   Review of patient's allergies indicates no known allergies.   Review of Systems Review of Systems  Constitutional: Negative for fever.  Respiratory: Negative for shortness of breath.   Cardiovascular: Negative for chest pain.  Gastrointestinal: Negative for vomiting.  Genitourinary: Negative for testicular pain.  Skin: Positive for wound.  All other systems reviewed and are negative.    Physical Exam Updated Vital Signs BP 138/88   Pulse 83   Temp 98.7 F (37.1 C) (Oral)   Resp  18   Ht 6' (1.829 m)   Wt 65.8 kg   SpO2 100%   BMI 19.67 kg/m   Physical Exam CONSTITUTIONAL: Well developed/well nourished HEAD: Normocephalic/atraumatic EYES: EOMI/PERRL ENMT: Mucous membranes moist NECK: supple no meningeal signs SPINE/BACK:entire spine nontender CV: S1/S2 noted, no murmurs/rubs/gallops noted LUNGS: Lungs are clear to auscultation bilaterally, no apparent distress ABDOMEN: soft, nontender, no rebound or guarding, bowel sounds noted throughout abdomen GU:no cva tenderness NEURO: Pt is awake/alert/appropriate, moves all extremitiesx4.  No facial droop.   EXTREMITIES: pulses normal/equal, full ROM.  Mild patch of erythema to left proximal/inner thigh.  No crepitus.  No significant induration/crepitus.  No abscess.  He can fully range left hip without difficulty.   No calf tenderness/edema.   SKIN: warm, color normal PSYCH: no abnormalities of mood noted, alert and oriented to situation   ED Treatments / Results  Labs (all labs ordered are listed, but only abnormal results are displayed) Labs Reviewed  CBC WITH DIFFERENTIAL/PLATELET - Abnormal; Notable for the following:       Result Value   HCT 38.7 (*)    All other components within normal limits    EKG  EKG Interpretation None       Radiology No results found.  Procedures Procedures (including critical care time)  Medications Ordered in ED Medications  doxycycline (VIBRA-TABS) tablet 100 mg (100 mg Oral Given 09/18/15 2011)     Initial Impression / Assessment and Plan / ED Course  I have reviewed the triage vital signs and the nursing notes.  Pertinent labs & imaging results that were available during my care of the patient were reviewed by me and considered in my medical decision making (see chart for details).  Clinical Course    Pt concerned for DVT  I actually this is more c/w cellulitis.  However he is not toxic Plan to check CBC as he has not had repeat labs since last  chemo Will order next DVT study but my suspicion this represents DVT is low   Final Clinical Impressions(s) / ED Diagnoses   Final diagnoses:  Cellulitis of left leg    New Prescriptions New Prescriptions   DOXYCYCLINE (VIBRAMYCIN) 100 MG CAPSULE    Take 1 capsule (100 mg total) by mouth 2 (two) times daily. One po bid x 7 days     Ripley Fraise, MD 09/18/15 2123

## 2015-09-18 NOTE — ED Triage Notes (Signed)
Pt c/o L groin pain x 4 days and L leg cramping yesterday.  Pain score 2/10.  Denies injury.  Pt reports he was "stepping in the bathtub when pain began."  Hx of Hodgkin Lymphoma.  Last chemo x 10 days ago.  Swelling noted to area.

## 2015-09-19 ENCOUNTER — Ambulatory Visit (HOSPITAL_BASED_OUTPATIENT_CLINIC_OR_DEPARTMENT_OTHER)
Admission: RE | Admit: 2015-09-19 | Discharge: 2015-09-19 | Disposition: A | Discharge status: Home / Self Care | Payer: No Typology Code available for payment source | Source: Ambulatory Visit | Attending: Emergency Medicine | Admitting: Emergency Medicine

## 2015-09-19 ENCOUNTER — Encounter (HOSPITAL_COMMUNITY): Payer: Self-pay | Admitting: Emergency Medicine

## 2015-09-19 ENCOUNTER — Emergency Department (HOSPITAL_COMMUNITY)
Admission: EM | Admit: 2015-09-19 | Discharge: 2015-09-19 | Disposition: A | Discharge status: Home / Self Care | Payer: No Typology Code available for payment source | Attending: Emergency Medicine | Admitting: Emergency Medicine

## 2015-09-19 DIAGNOSIS — M79609 Pain in unspecified limb: Secondary | ICD-10-CM | POA: Diagnosis not present

## 2015-09-19 DIAGNOSIS — Z8571 Personal history of Hodgkin lymphoma: Secondary | ICD-10-CM | POA: Diagnosis not present

## 2015-09-19 DIAGNOSIS — F17210 Nicotine dependence, cigarettes, uncomplicated: Secondary | ICD-10-CM | POA: Diagnosis not present

## 2015-09-19 DIAGNOSIS — Z7901 Long term (current) use of anticoagulants: Secondary | ICD-10-CM | POA: Diagnosis not present

## 2015-09-19 DIAGNOSIS — I824Z2 Acute embolism and thrombosis of unspecified deep veins of left distal lower extremity: Secondary | ICD-10-CM | POA: Diagnosis not present

## 2015-09-19 DIAGNOSIS — I82812 Embolism and thrombosis of superficial veins of left lower extremities: Secondary | ICD-10-CM | POA: Insufficient documentation

## 2015-09-19 DIAGNOSIS — M79605 Pain in left leg: Secondary | ICD-10-CM

## 2015-09-19 DIAGNOSIS — I82402 Acute embolism and thrombosis of unspecified deep veins of left lower extremity: Secondary | ICD-10-CM | POA: Diagnosis present

## 2015-09-19 LAB — I-STAT CHEM 8, ED
BUN: 7 mg/dL (ref 6–20)
CHLORIDE: 101 mmol/L (ref 101–111)
CREATININE: 0.8 mg/dL (ref 0.61–1.24)
Calcium, Ion: 1.21 mmol/L (ref 1.13–1.30)
GLUCOSE: 90 mg/dL (ref 65–99)
HCT: 43 % (ref 39.0–52.0)
Hemoglobin: 14.6 g/dL (ref 13.0–17.0)
POTASSIUM: 4.2 mmol/L (ref 3.5–5.1)
Sodium: 139 mmol/L (ref 135–145)
TCO2: 27 mmol/L (ref 0–100)

## 2015-09-19 MED ORDER — RIVAROXABAN 15 MG PO TABS
15.0000 mg | ORAL_TABLET | Freq: Once | ORAL | Status: AC
  Administered 2015-09-19: 15 mg via ORAL
  Filled 2015-09-19: qty 1

## 2015-09-19 MED ORDER — RIVAROXABAN (XARELTO) VTE STARTER PACK (15 & 20 MG): ORAL_TABLET | ORAL | 0 refills | Status: DC

## 2015-09-19 NOTE — Discharge Instructions (Addendum)
Information on my medicine - XARELTO (rivaroxaban)  This medication education was reviewed with me or my healthcare representative as part of my discharge preparation.   WHY WAS XARELTO PRESCRIBED FOR YOU? Xarelto was prescribed to treat blood clots that may have been found in the veins of your legs (deep vein thrombosis) or in your lungs (pulmonary embolism) and to reduce the risk of them occurring again.    WHAT DO YOU NEED TO KNOW ABOUT XARELTO? The starting dose is one 15 mg tablet taken TWICE daily with food for the FIRST 21 DAYS then on (enter date)  10/10/15 the dose is changed to one 20 mg tablet taken ONCE A DAY with your evening meal.  DO NOT stop taking Xarelto without talking to the health care provider who prescribed the medication.  Refill your prescription for 20 mg tablets before you run out.  After discharge, you should have regular check-up appointments with your healthcare provider that is prescribing your Xarelto.  In the future your dose may need to be changed if your kidney function changes by a significant amount.  WHAT DO YOU DO IF YOU MISS A DOSE? If you are taking Xarelto TWICE DAILY and you miss a dose, take it as soon as you remember. You may take two 15 mg tablets (total 30 mg) at the same time then resume your regularly scheduled 15 mg twice daily the next day.  If you are taking Xarelto ONCE DAILY and you miss a dose, take it as soon as you remember on the same day then continue your regularly scheduled once daily regimen the next day. Do not take two doses of Xarelto at the same time.   IMPORTANT SAFETY INFORMATION Xarelto is a blood thinner medicine that can cause bleeding. You should call your healthcare provider right away if you experience any of the following: Bleeding from an injury or your nose that does not stop. Unusual colored urine (red or dark brown) or unusual colored stools (red or black). Unusual bruising for unknown reasons. A serious  fall or if you hit your head (even if there is no bleeding).  Some medicines may interact with Xarelto and might increase your risk of bleeding while on Xarelto. To help avoid this, consult your healthcare provider or pharmacist prior to using any new prescription or non-prescription medications, including herbals, vitamins, non-steroidal anti-inflammatory drugs (NSAIDs) and supplements.  This website has more information on Xarelto: [email or web address]

## 2015-09-19 NOTE — Progress Notes (Signed)
*  PRELIMINARY RESULTS* Vascular Ultrasound Left lower extremity venous duplex has been completed.  Preliminary findings: Superficial thrombosis is noted in the left greater saphenous vein, from distal thigh extending up to groin. Non-occlusive DVT is extending into the left common femoral vein, at the saphenofemoral junction.    8:54 am Instructed patient to [facility] ED to be seen for treatment.      Landry Mellow, RDMS, RVT   09/19/2015, 8:52 AM

## 2015-09-19 NOTE — ED Provider Notes (Addendum)
[place] DEPT Provider Note   CSN: [id number] Arrival date & time: 09/19/15  0900  First Provider Contact:  First MD Initiated Contact with Patient 09/19/15 0919        History   Chief Complaint Chief Complaint  Patient presents with  . Leg Pain  . DVT    HPI Ross Hansen is a 30 y.o. male.  30 yo M with PMHx of Hodkin lymphoma, Factor V leiden who presents from U/S for evaluation of DVT. Pt seen yesterday - diagnosed with cellulitis but given history, U/S ordered for evaluation. Pt went to U/S today which shows superficial femoral clot extending into CFV. No numbness or weakness. No CP, SOB, or pleurisy. No h/o DVTs. Redness improving per report on ABX. He describes mild, aching pain in proximal thigh made worse with palpation.      Past Medical History:  Diagnosis Date  . Allergy   . Cancer [facility])    hodgkin lymphoma  . Familial multiple factor deficiency syndrome, type IV (HCC)    "Factor IV Leiden"    Patient Active Problem List   Diagnosis Date Noted  . Tobacco abuse 08/11/2015  . Factor V Leiden ([place]) 07/19/2015  . 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 Inguinal lymphadenopathy 07/11/2015    Past Surgical History:  Procedure Laterality Date  . TONSILLECTOMY    . tubes in ears Bilateral        Home Medications    Prior to Admission medications   Medication Sig Start Date End Date Taking? Authorizing Provider  dexamethasone (DECADRON) 4 MG tablet Take 4-8 mg by mouth See admin instructions. Pt is to take two tablets once daily on the day after chemo and then two tablets two times daily for two days.   Yes Historical Provider, MD  ondansetron (ZOFRAN) 8 MG tablet Take 8 mg by mouth 2 (two) times daily as needed for nausea or vomiting.   Yes Historical Provider, MD  prochlorperazine (COMPAZINE) 10 MG tablet Take 10 mg by mouth every 6 (six) hours as needed for nausea or vomiting.   Yes  Historical Provider, MD  albuterol (PROVENTIL HFA;VENTOLIN HFA) 108 (90 Base) MCG/ACT inhaler Inhale 2 puffs into the lungs every 6 (six) hours as needed for wheezing or shortness of breath. Patient not taking: Reported on 09/18/2015 07/12/15   Timmothy Euler, MD  doxycycline (VIBRAMYCIN) 100 MG capsule Take 1 capsule (100 mg total) by mouth 2 (two) times daily. One po bid x 7 days 09/18/15   Ripley Fraise, MD  lidocaine-prilocaine (EMLA) cream Apply 1 application topically as needed (prior to accessing port).    Historical Provider, MD  Rivaroxaban 15 & 20 MG TBPK Take as directed on package: Start with one 15mg  tablet by mouth twice a day with food. On Day 22, switch to one 20mg  tablet once a day with food. 09/19/15   Duffy Bruce, MD    Family History Family History  Problem Relation Age of Onset  . Depression Mother   . Hypertension Father   . Diabetes Maternal Grandmother   . Diabetes Maternal Grandfather   . Cancer Maternal Grandfather     lung ca  . Diabetes Paternal Grandmother   . Diabetes Paternal Grandfather     Social History Social History  Substance Use Topics  . Smoking status: Current Every Day Smoker    Packs/day: 0.50    Years: 19.00    Types: Cigarettes  .  Smokeless tobacco: Never Used  . Alcohol use Yes     Allergies   Review of patient's allergies indicates no known allergies.   Review of Systems Review of Systems  Constitutional: Negative for chills, fatigue and fever.  HENT: Negative for congestion and rhinorrhea.   Eyes: Negative for visual disturbance.  Respiratory: Negative for cough, chest tightness and shortness of breath.   Cardiovascular: Positive for leg swelling. Negative for chest pain.  Gastrointestinal: Negative for abdominal pain, diarrhea and rectal pain.  Genitourinary: Negative for dysuria and flank pain.  Musculoskeletal: Negative for neck pain and neck stiffness.  Skin: Negative for rash.  Allergic/Immunologic: Negative for  immunocompromised state.  Neurological: Negative for syncope, weakness and headaches.     Physical Exam Updated Vital Signs BP 124/87 (BP Location: Left Arm)   Pulse 72   Temp 98.1 F (36.7 C) (Oral)   Resp 16   Ht 6' (1.829 m)   Wt 145 lb (65.8 kg)   SpO2 100%   BMI 19.67 kg/m   Physical Exam  Constitutional: He appears well-developed and well-nourished. No distress.  HENT:  Head: Normocephalic.  Mouth/Throat: Oropharynx is clear and moist. No oropharyngeal exudate.  Eyes: Conjunctivae are normal. Pupils are equal, round, and reactive to light.  Neck: Neck supple.  Cardiovascular: Normal rate, regular rhythm, normal heart sounds and intact distal pulses.  Exam reveals no friction rub.   No murmur heard. Pulmonary/Chest: Effort normal and breath sounds normal. No respiratory distress. He has no wheezes.  Abdominal: Soft. Bowel sounds are normal.  Musculoskeletal: He exhibits no edema.  Neurological: He is alert.  Nursing note and vitals reviewed.  LOWER EXTREMITY EXAM: LEFT  INSPECTION & PALPATION: Mild TTP over medial, proximal thigh. Area outlined from yesterday visualized, with minimal erythema that has receded from borders. No fluctuance. No open wounds.  SENSORY: sensation is intact to light touch in:  Superficial peroneal nerve distribution (over dorsum of foot) Deep peroneal nerve distribution (over first dorsal web space) Sural nerve distribution (over lateral aspect 5th metatarsal) Saphenous nerve distribution (over medial instep)  MOTOR:  + Motor EHL (great toe dorsiflexion) + FHL (great toe plantar flexion)  + TA (ankle dorsiflexion)  + GSC (ankle plantar flexion)  VASCULAR: 2+ dorsalis pedis and posterior tibialis pulses Capillary refill < 2 sec, toes warm and well-perfused  COMPARTMENTS: Soft, warm, well-perfused No pain with passive extension No parethesias  ED Treatments / Results  Labs (all labs ordered are listed, but only abnormal results  are displayed) Labs Reviewed  I-STAT CHEM 8, ED    EKG  EKG Interpretation None       Radiology No results found.  Procedures Procedures (including critical care time)  Medications Ordered in ED Medications  Rivaroxaban (XARELTO) tablet 15 mg (15 mg Oral Given 09/19/15 1031)     Initial Impression / Assessment and Plan / ED Course  I have reviewed the triage vital signs and the nursing notes.  Pertinent labs & imaging results that were available during my care of the patient were reviewed by me and considered in my medical decision making (see chart for details).  Clinical Course   30 yo M with PMHx of Hodgkin's, Factor V leiden who presents from [facility] for acute DVT. Non-occlusive but does extend to CFV. Labs, records from recent visit reviewed. Chemistries show normal renal function today. Distal NV is intact and pt has no signs of phlegmasia. Pulses 2+. No CP, SOB, tachypnea, hypoxia, or signs of PE. Discussed  case, diagnosis with pt's Oncologist, Dr. Alvy Bimler. She advises Xarelto, outpt follow-up this Thursday. Regarding pt's possible cellulitis, I suspect superficial thrombophlebitis. This seems to be markedly improving on ABX per review of images on pt's phone. Will advise continued doxy. Pt in agreement. Pharmacy in to discuss Xarelto and pt provided with starter pack. Return precautions given. Will d/c home.  Final Clinical Impressions(s) / ED Diagnoses   Final diagnoses:  Leg DVT (deep venous thromboembolism), acute, left [facility])    New Prescriptions Discharge Medication List as of 09/19/2015 11:15 AM    START taking these medications   Details  Rivaroxaban 15 & 20 MG TBPK Take as directed on package: Start with one 15mg  tablet by mouth twice a day with food. On Day 22, switch to one 20mg  tablet once a day with food., Print         Duffy Bruce, MD 09/19/15 2033    Duffy Bruce, MD 09/19/15 [phone]

## 2015-09-19 NOTE — ED Notes (Signed)
Pharmacy at bedside educating patient on [place]

## 2015-09-19 NOTE — ED Triage Notes (Signed)
Pt. Just returned from [place] with DX of blood clot to left upper leg per pt.

## 2015-09-22 ENCOUNTER — Encounter: Payer: Self-pay | Admitting: Hematology and Oncology

## 2015-09-22 ENCOUNTER — Ambulatory Visit (HOSPITAL_BASED_OUTPATIENT_CLINIC_OR_DEPARTMENT_OTHER): Payer: No Typology Code available for payment source | Admitting: Hematology and Oncology

## 2015-09-22 ENCOUNTER — Ambulatory Visit: Payer: No Typology Code available for payment source

## 2015-09-22 ENCOUNTER — Telehealth: Payer: Self-pay | Admitting: Hematology and Oncology

## 2015-09-22 ENCOUNTER — Ambulatory Visit (HOSPITAL_BASED_OUTPATIENT_CLINIC_OR_DEPARTMENT_OTHER): Payer: No Typology Code available for payment source

## 2015-09-22 ENCOUNTER — Other Ambulatory Visit (HOSPITAL_BASED_OUTPATIENT_CLINIC_OR_DEPARTMENT_OTHER): Payer: No Typology Code available for payment source

## 2015-09-22 ENCOUNTER — Ambulatory Visit (HOSPITAL_COMMUNITY)
Admission: RE | Admit: 2015-09-22 | Discharge: 2015-09-22 | Disposition: A | Discharge status: Home / Self Care | Payer: No Typology Code available for payment source | Source: Ambulatory Visit | Attending: Hematology and Oncology | Admitting: Hematology and Oncology

## 2015-09-22 VITALS — BP 122/78 | HR 72 | Temp 98.1°F | Resp 18 | Ht 72.0 in | Wt 147.6 lb

## 2015-09-22 DIAGNOSIS — J449 Chronic obstructive pulmonary disease, unspecified: Secondary | ICD-10-CM | POA: Diagnosis not present

## 2015-09-22 DIAGNOSIS — C8118 Nodular sclerosis classical Hodgkin lymphoma, lymph nodes of multiple sites: Secondary | ICD-10-CM | POA: Insufficient documentation

## 2015-09-22 DIAGNOSIS — D702 Other drug-induced agranulocytosis: Secondary | ICD-10-CM | POA: Insufficient documentation

## 2015-09-22 DIAGNOSIS — R05 Cough: Secondary | ICD-10-CM

## 2015-09-22 DIAGNOSIS — Z86718 Personal history of other venous thrombosis and embolism: Secondary | ICD-10-CM | POA: Insufficient documentation

## 2015-09-22 DIAGNOSIS — I82412 Acute embolism and thrombosis of left femoral vein: Secondary | ICD-10-CM

## 2015-09-22 DIAGNOSIS — Z5111 Encounter for antineoplastic chemotherapy: Secondary | ICD-10-CM

## 2015-09-22 DIAGNOSIS — Z720 Tobacco use: Secondary | ICD-10-CM

## 2015-09-22 DIAGNOSIS — R053 Chronic cough: Secondary | ICD-10-CM

## 2015-09-22 LAB — COMPREHENSIVE METABOLIC PANEL
ALBUMIN: 3.7 g/dL (ref 3.5–5.0)
ALK PHOS: 61 U/L (ref 40–150)
ALT: 23 U/L (ref 0–55)
AST: 19 U/L (ref 5–34)
Anion Gap: 12 mEq/L — ABNORMAL HIGH (ref 3–11)
BUN: 7.5 mg/dL (ref 7.0–26.0)
CO2: 23 meq/L (ref 22–29)
Calcium: 8.9 mg/dL (ref 8.4–10.4)
Chloride: 105 mEq/L (ref 98–109)
Creatinine: 0.8 mg/dL (ref 0.7–1.3)
EGFR: >90 mL/min/{1.73_m2} (ref >90)
GLUCOSE: 163 mg/dL — AB (ref 70–140)
POTASSIUM: 3.8 meq/L (ref 3.5–5.1)
SODIUM: 140 meq/L (ref 136–145)
Total Bilirubin: <0.3 mg/dL (ref 0.20–1.20)
Total Protein: 6.8 g/dL (ref 6.4–8.3)

## 2015-09-22 LAB — CBC WITH DIFFERENTIAL/PLATELET
BASO%: 2.2 % — AB (ref 0.0–2.0)
Basophils Absolute: 0.1 10*3/uL (ref 0.0–0.1)
EOS%: 4.4 % (ref 0.0–7.0)
Eosinophils Absolute: 0.1 10*3/uL (ref 0.0–0.5)
HCT: 40 % (ref 38.4–49.9)
HEMOGLOBIN: 14.1 g/dL (ref 13.0–17.1)
LYMPH#: 1.1 10*3/uL (ref 0.9–3.3)
LYMPH%: 33.9 % (ref 14.0–49.0)
MCH: 31.1 pg (ref 27.2–33.4)
MCHC: 35.3 g/dL (ref 32.0–36.0)
MCV: 88.1 fL (ref 79.3–98.0)
MONO#: 0.8 10*3/uL (ref 0.1–0.9)
MONO%: 25.4 % — ABNORMAL HIGH (ref 0.0–14.0)
NEUT#: 1.1 10*3/uL — ABNORMAL LOW (ref 1.5–6.5)
NEUT%: 34.1 % — AB (ref 39.0–75.0)
NRBC: 0 % (ref 0–0)
Platelets: 191 10*3/uL (ref 140–400)
RBC: 4.54 10*6/uL (ref 4.20–5.82)
RDW: 13.3 % (ref 11.0–14.6)
WBC: 3.2 10*3/uL — ABNORMAL LOW (ref 4.0–10.3)

## 2015-09-22 MED ORDER — HEPARIN SOD (PORK) LOCK FLUSH 100 UNIT/ML IV SOLN
500.0000 [IU] | Freq: Once | INTRAVENOUS | Status: AC | PRN
  Administered 2015-09-22: 500 [IU]
  Filled 2015-09-22: qty 5

## 2015-09-22 MED ORDER — SODIUM CHLORIDE 0.9 % IV SOLN
Freq: Once | INTRAVENOUS | Status: AC
  Administered 2015-09-22: 10:00:00 via INTRAVENOUS

## 2015-09-22 MED ORDER — PALONOSETRON HCL INJECTION 0.25 MG/5ML
0.2500 mg | Freq: Once | INTRAVENOUS | Status: AC
  Administered 2015-09-22: 0.25 mg via INTRAVENOUS

## 2015-09-22 MED ORDER — SODIUM CHLORIDE 0.9% FLUSH
10.0000 mL | INTRAVENOUS | Status: DC | PRN
  Administered 2015-09-22: 10 mL
  Filled 2015-09-22: qty 10

## 2015-09-22 MED ORDER — SODIUM CHLORIDE 0.9 % IV SOLN
Freq: Once | INTRAVENOUS | Status: AC
  Administered 2015-09-22: 10:00:00 via INTRAVENOUS
  Filled 2015-09-22: qty 5

## 2015-09-22 MED ORDER — DOXORUBICIN HCL CHEMO IV INJECTION 2 MG/ML
25.0000 mg/m2 | Freq: Once | INTRAVENOUS | Status: AC
  Administered 2015-09-22: 46 mg via INTRAVENOUS
  Filled 2015-09-22: qty 23

## 2015-09-22 MED ORDER — SODIUM CHLORIDE 0.9 % IV SOLN
375.0000 mg/m2 | Freq: Once | INTRAVENOUS | Status: AC
  Administered 2015-09-22: 700 mg via INTRAVENOUS
  Filled 2015-09-22: qty 35

## 2015-09-22 MED ORDER — PALONOSETRON HCL INJECTION 0.25 MG/5ML
INTRAVENOUS | Status: AC
  Filled 2015-09-22: qty 5

## 2015-09-22 MED ORDER — VINBLASTINE SULFATE CHEMO INJECTION 1 MG/ML
5.9500 mg/m2 | Freq: Once | INTRAVENOUS | Status: AC
  Administered 2015-09-22: 11 mg via INTRAVENOUS
  Filled 2015-09-22: qty 11

## 2015-09-22 NOTE — Assessment & Plan Note (Signed)
Overall, he tolerated treatment well without excessive side effects. The recent diagnosis of blood clot is unrelated. I am a little concerned about the mild pulmonary congestion. It could very well be due to minor bronchitis from COPD/smoking. I will order chest x-ray for further evaluation. I would like to hold bleomycin for this cycle and reassess in 2 weeks. Despite mild leukopenia, I will proceed with treatment without delay or dose adjustment

## 2015-09-22 NOTE — Assessment & Plan Note (Signed)
I spent some time counseling the patient the importance of tobacco cessation. he is currently attempting to quit on his own 

## 2015-09-22 NOTE — Assessment & Plan Note (Signed)
He has mild cough with congestion. He is on antibiotic therapy. I will order a chest x-ray for further evaluation and whole current cycle of bleomycin

## 2015-09-22 NOTE — Patient Instructions (Addendum)
[facility] Discharge Instructions for Patients Receiving Chemotherapy  Today you received the following chemotherapy agents Doxyrubicin, Vincristine, DTIC  To help prevent nausea and vomiting after your treatment, we encourage you to take your nausea medication    If you develop nausea and vomiting that is not controlled by your nausea medication, call the clinic.   BELOW ARE SYMPTOMS THAT SHOULD BE REPORTED IMMEDIATELY:  *FEVER GREATER THAN 100.5 F  *CHILLS WITH OR WITHOUT FEVER  NAUSEA AND VOMITING THAT IS NOT CONTROLLED WITH YOUR NAUSEA MEDICATION  *UNUSUAL SHORTNESS OF BREATH  *UNUSUAL BRUISING OR BLEEDING  TENDERNESS IN MOUTH AND THROAT WITH OR WITHOUT PRESENCE OF ULCERS  *URINARY PROBLEMS  *BOWEL PROBLEMS  UNUSUAL RASH Items with * indicate a potential emergency and should be followed up as soon as possible.  Feel free to call the clinic you have any questions or concerns. The clinic phone number is [phone].  Please show the [facility] at check-in to the Emergency Department and triage nurse.

## 2015-09-22 NOTE — Assessment & Plan Note (Signed)
The patient has extensive DVT extending to the left femoral vein. He had recent diagnosis of blood clot and smoking. He is currently on anticoagulation therapy and appears to be doing well without bleeding complication

## 2015-09-22 NOTE — Telephone Encounter (Signed)
per pof to sch pt appt-gave pt copy of avs °

## 2015-09-22 NOTE — Assessment & Plan Note (Signed)
This is likely due to recent treatment. The patient denies recent history of fevers, cough, chills, diarrhea or dysuria. He is asymptomatic from the leukopenia. I will observe for now.  I will continue the chemotherapy at current dose without dosage adjustment.  If the leukopenia gets progressive worse in the future, I might have to delay his treatment or adjust the chemotherapy dose. 

## 2015-09-22 NOTE — Progress Notes (Signed)
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     07/28/2015 Pathology Results    Accession: [id number] core needle biopsy of the right supraclavicular region came back positive for classical Hodgkin lymphoma     08/22/2015 Bone Marrow Biopsy    Accession: [id number] Bone marrow is negative for involvement     08/22/2015 Procedure    He has port placement and bone marrow biopsy     08/23/2015 Imaging    ECHO showed normal EF     08/24/2015 PET scan    Bilateral hypermetabolic supraclavicular lymphadenopathy and mediastinal adenopathy consistent with known Hodgkin's lymphoma. 2. No disease is identified below the diaphragms.     08/25/2015 -  Chemotherapy    He is started on ABVD     09/19/2015 Imaging    Superficial thrombosis is noted in the left greater saphenous vein, from distal thigh extending up to groin. Non-occlusive DVT is extending into the left common femoral vein, at the   saphenofemoral junction.     09/22/2015 Adverse Reaction    Due to cough, Bleomycin is placed on hold      INTERVAL HISTORY: Please see below for problem oriented charting. He is seen prior to cycle 2 day 1 of chemotherapy. He was recently diagnosed with probable cellulitis and left lower extremity DVT. He had mild productive cough, improving. Denies recent fever or chills. He is attempting to quit smoking. The patient denies any recent signs  or symptoms of bleeding such as spontaneous epistaxis, hematuria or hematochezia. His left lower extremity pain is improving while on anticoagulation therapy  REVIEW OF SYSTEMS:   Constitutional: Denies fevers, chills or abnormal weight loss Eyes: Denies blurriness of vision Ears, nose, mouth, throat, and face: Denies mucositis or sore throat Respiratory: Denies cough, dyspnea or wheezes Cardiovascular: Denies palpitation, chest discomfort or lower extremity swelling Gastrointestinal:  Denies nausea, heartburn or change in bowel habits Skin: Denies abnormal skin rashes Lymphatics: Denies new lymphadenopathy or easy bruising Neurological:Denies numbness, tingling or new weaknesses Behavioral/Psych: Mood is stable, no new changes  All other systems were reviewed with the patient and are negative.  I have reviewed the past medical history, past surgical history, social history and family history with the patient and they are unchanged from previous note.  ALLERGIES:  has No Known Allergies.  MEDICATIONS:  Current Outpatient Prescriptions  Medication Sig Dispense Refill  . albuterol (PROVENTIL HFA;VENTOLIN HFA) 108 (90 Base) MCG/ACT inhaler Inhale 2 puffs into the lungs every 6 (six) hours as needed for wheezing or shortness of breath. 1 Inhaler 0  . dexamethasone (DECADRON) 4 MG tablet Take 4-8 mg by mouth See admin instructions. Pt is to take two tablets once daily on the day after chemo and then two tablets two times daily for two days.    Marland Kitchen doxycycline (VIBRAMYCIN) 100 MG capsule Take 1 capsule (100 mg total) by mouth 2 (two) times daily. One po bid x 7 days 14 capsule 0  .  lidocaine-prilocaine (EMLA) cream Apply 1 application topically as needed (prior to accessing port).    . ondansetron (ZOFRAN) 8 MG tablet Take 8 mg by mouth 2 (two) times daily as needed for nausea or vomiting.    . prochlorperazine (COMPAZINE) 10 MG tablet Take 10 mg by mouth every 6 (six) hours as needed for nausea  or vomiting.    . Rivaroxaban 15 & 20 MG TBPK Take as directed on package: Start with one 6m tablet by mouth twice a day with food. On Day 22, switch to one 29mtablet once a day with food. 51 each 0   No current facility-administered medications for this visit.     PHYSICAL EXAMINATION: ECOG PERFORMANCE STATUS: 1 - Symptomatic but completely ambulatory  Vitals:   09/22/15 0837  BP: 122/78  Pulse: 72  Resp: 18  Temp: 98.1 F (36.7 C)   Filed Weights   09/22/15 0837  Weight: 147 lb 9.6 oz (67 kg)    GENERAL:alert, no distress and comfortable SKIN: skin color, texture, turgor are normal, no rashes or significant lesions EYES: normal, Conjunctiva are pink and non-injected, sclera clear OROPHARYNX:no exudate, no erythema and lips, buccal mucosa, and tongue normal  NECK: supple, thyroid normal size, non-tender, without nodularity LYMPH:  no palpable lymphadenopathy in the cervical, axillary or inguinal LUNGS: clear to auscultation and percussion with normal breathing effort HEART: regular rate & rhythm and no murmurs and no lower extremity edema ABDOMEN:abdomen soft, non-tender and normal bowel sounds Musculoskeletal:no cyanosis of digits and no clubbing  NEURO: alert & oriented x 3 with fluent speech, no focal motor/sensory deficits  LABORATORY DATA:  I have reviewed the data as listed    Component Value Date/Time   NA 140 09/22/2015 0816   K 3.8 09/22/2015 0816   CL 101 09/19/2015 0939   CO2 23 09/22/2015 0816   GLUCOSE 163 (H) 09/22/2015 0816   BUN 7.5 09/22/2015 0816   CREATININE 0.8 09/22/2015 0816   CALCIUM 8.9 09/22/2015 0816   PROT 6.8 09/22/2015 0816   ALBUMIN 3.7 09/22/2015 0816   AST 19 09/22/2015 0816   ALT 23 09/22/2015 0816   ALKPHOS 61 09/22/2015 0816   BILITOT <0.30 09/22/2015 0816   GFRNONAA 112 07/11/2015 1630   GFRAA 129 07/11/2015 1630    No results found for: SPEP, UPEP  Lab Results  Component Value Date   WBC 3.2 (L) 09/22/2015    NEUTROABS 1.1 (L) 09/22/2015   HGB 14.1 09/22/2015   HCT 40.0 09/22/2015   MCV 88.1 09/22/2015   PLT 191 09/22/2015      Chemistry      Component Value Date/Time   NA 140 09/22/2015 0816   K 3.8 09/22/2015 0816   CL 101 09/19/2015 0939   CO2 23 09/22/2015 0816   BUN 7.5 09/22/2015 0816   CREATININE 0.8 09/22/2015 0816      Component Value Date/Time   CALCIUM 8.9 09/22/2015 0816   ALKPHOS 61 09/22/2015 0816   AST 19 09/22/2015 0816   ALT 23 09/22/2015 0816   BILITOT <0.30 09/22/2015 0816       RADIOGRAPHIC STUDIES: I have personally reviewed the radiological images as listed and agreed with the findings in the report. Dg Chest 2 View  Result Date: 09/22/2015 CLINICAL DATA:  Cough, history of lymphoma, recent deep venous thrombosis, smoking history EXAM: CHEST  2 VIEW COMPARISON:  Chest x-ray of 07/11/2015 and CT chest of 08/24/2015 FINDINGS: No active infiltrate or effusion is seen. The lungs  are slightly hyperaerated. A right-sided Port-A-Cath is present with the tip overlying the lower SVC. Mediastinal and hilar contours are unremarkable and the heart is within normal limits in size. No bony abnormality is seen. IMPRESSION: No active lung disease. Slight hyper aeration. Port-A-Cath tip overlies the lower SVC. Electronically Signed   By: Ivar Drape M.D.   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) Overall, he tolerated treatment well without excessive side effects. The recent diagnosis of blood clot is unrelated. I am a little concerned about the mild pulmonary congestion. It could very well be due to minor bronchitis from COPD/smoking. I will order chest x-ray for further evaluation. I would like to hold bleomycin for this cycle and reassess in 2 weeks. Despite mild leukopenia, I will proceed with treatment without delay or dose adjustment  Acute deep vein thrombosis (DVT) of left  femoral vein (HCC) The patient has extensive DVT extending to the left femoral vein. He had recent diagnosis of blood clot and smoking. He is currently on anticoagulation therapy and appears to be doing well without bleeding complication  Tobacco abuse I spent some time counseling the patient the importance of tobacco cessation. he is currently attempting to quit on his own   Cough, persistent He has mild cough with congestion. He is on antibiotic therapy. I will order a chest x-ray for further evaluation and whole current cycle of bleomycin  Drug-induced neutropenia ([place]) This is likely due to recent treatment. The patient denies recent history of fevers, cough, chills, diarrhea or dysuria. He is asymptomatic from the leukopenia. I will observe for now.  I will continue the chemotherapy at current dose without dosage adjustment.  If the leukopenia gets progressive worse in the future, I might have to delay his treatment or adjust the chemotherapy dose.     Orders Placed This Encounter  Procedures  . DG Chest 2 View    Standing Status:   Future    Number of Occurrences:   1    Standing Expiration Date:   10/26/2016    Order Specific Question:   Reason for exam:    Answer:   cough, lymphoma, recent DVT    Order Specific Question:   Preferred imaging location?    Answer:   [facility]   All questions were answered. The patient knows to call the clinic with any problems, questions or concerns. No barriers to learning was detected. I spent 30 minutes counseling the patient face to face. The total time spent in the appointment was 40 minutes and more than 50% was on counseling and review of test results     [facility], [place], MD 09/22/2015 9:45 AM

## 2015-09-29 ENCOUNTER — Other Ambulatory Visit: Payer: Self-pay | Admitting: *Deleted

## 2015-09-29 ENCOUNTER — Ambulatory Visit (HOSPITAL_BASED_OUTPATIENT_CLINIC_OR_DEPARTMENT_OTHER): Payer: No Typology Code available for payment source | Admitting: Hematology and Oncology

## 2015-09-29 ENCOUNTER — Encounter (HOSPITAL_COMMUNITY): Payer: Self-pay | Admitting: Emergency Medicine

## 2015-09-29 ENCOUNTER — Telehealth: Payer: Self-pay | Admitting: Hematology and Oncology

## 2015-09-29 ENCOUNTER — Emergency Department (HOSPITAL_COMMUNITY)
Admission: EM | Admit: 2015-09-29 | Discharge: 2015-09-29 | Disposition: A | Discharge status: Home / Self Care | Payer: No Typology Code available for payment source | Attending: Emergency Medicine | Admitting: Emergency Medicine

## 2015-09-29 ENCOUNTER — Telehealth: Payer: Self-pay | Admitting: *Deleted

## 2015-09-29 VITALS — BP 125/75 | HR 79 | Temp 98.3°F | Resp 18 | Ht 72.0 in | Wt 145.1 lb

## 2015-09-29 DIAGNOSIS — F17210 Nicotine dependence, cigarettes, uncomplicated: Secondary | ICD-10-CM | POA: Insufficient documentation

## 2015-09-29 DIAGNOSIS — I809 Phlebitis and thrombophlebitis of unspecified site: Secondary | ICD-10-CM | POA: Diagnosis not present

## 2015-09-29 DIAGNOSIS — Z7901 Long term (current) use of anticoagulants: Secondary | ICD-10-CM | POA: Insufficient documentation

## 2015-09-29 DIAGNOSIS — I82412 Acute embolism and thrombosis of left femoral vein: Secondary | ICD-10-CM | POA: Diagnosis not present

## 2015-09-29 DIAGNOSIS — C8118 Nodular sclerosis classical Hodgkin lymphoma, lymph nodes of multiple sites: Secondary | ICD-10-CM | POA: Diagnosis not present

## 2015-09-29 DIAGNOSIS — Z859 Personal history of malignant neoplasm, unspecified: Secondary | ICD-10-CM | POA: Diagnosis not present

## 2015-09-29 DIAGNOSIS — Z79899 Other long term (current) drug therapy: Secondary | ICD-10-CM | POA: Insufficient documentation

## 2015-09-29 DIAGNOSIS — M79662 Pain in left lower leg: Secondary | ICD-10-CM | POA: Diagnosis present

## 2015-09-29 LAB — BASIC METABOLIC PANEL
ANION GAP: 6 (ref 5–15)
BUN: 9 mg/dL (ref 6–20)
CHLORIDE: 102 mmol/L (ref 101–111)
CO2: 28 mmol/L (ref 22–32)
Calcium: 9.3 mg/dL (ref 8.9–10.3)
Creatinine, Ser: 0.75 mg/dL (ref 0.61–1.24)
Glucose, Bld: 102 mg/dL — ABNORMAL HIGH (ref 65–99)
POTASSIUM: 3.9 mmol/L (ref 3.5–5.1)
SODIUM: 136 mmol/L (ref 135–145)

## 2015-09-29 LAB — CBC WITH DIFFERENTIAL/PLATELET
BASOS ABS: 0 10*3/uL (ref 0.0–0.1)
BASOS PCT: 0 %
EOS ABS: 0.2 10*3/uL (ref 0.0–0.7)
Eosinophils Relative: 3 %
HCT: 36.9 % — ABNORMAL LOW (ref 39.0–52.0)
HEMOGLOBIN: 12.8 g/dL — AB (ref 13.0–17.0)
LYMPHS ABS: 1.8 10*3/uL (ref 0.7–4.0)
Lymphocytes Relative: 35 %
MCH: 30.5 pg (ref 26.0–34.0)
MCHC: 34.7 g/dL (ref 30.0–36.0)
MCV: 88.1 fL (ref 78.0–100.0)
Monocytes Absolute: 0.2 10*3/uL (ref 0.1–1.0)
Monocytes Relative: 3 %
NEUTROS PCT: 59 %
Neutro Abs: 2.9 10*3/uL (ref 1.7–7.7)
PLATELETS: 184 10*3/uL (ref 150–400)
RBC: 4.19 MIL/uL — AB (ref 4.22–5.81)
RDW: 12.5 % (ref 11.5–15.5)
WBC: 5 10*3/uL (ref 4.0–10.5)

## 2015-09-29 MED ORDER — CEPHALEXIN 500 MG PO CAPS: 500.0000 mg | ORAL_CAPSULE | Freq: Three times a day (TID) | ORAL | 0 refills | Status: DC

## 2015-09-29 NOTE — ED Provider Notes (Signed)
[facility] DEPT Provider Note   CSN: [id number] Arrival date & time: 09/29/15  0106  First Provider Contact:  First MD Initiated Contact with Patient 09/29/15 0202     By signing my name below, I, Dora Sims, attest that this documentation has been prepared under the direction and in the presence of physician practitioner, Varney Biles, MD. Electronically Signed: Dora Sims, Scribe. 09/29/2015. 2:02 AM.  History   Chief Complaint Chief Complaint  Patient presents with  . Leg Pain    The history is provided by the patient and the spouse. No language interpreter was used.     HPI Comments: Ross Hansen is a 30 y.o. male who presents to the Emergency Department complaining of sudden onset, constant, left lower extremity redness and soreness beginning last night. He notes redness and soreness in his left thigh and left lower leg. Pt was treated for a blood clot in his left leg 10 days ago after experiencing the same symptoms; he was prescribed doxycycline and Xarelto here in the ER. Pt reports that his symptoms resolved with the medications, but his left leg became red and sore last night. He is concerned he may have another blood clot. Pt is currently in chemotherapy for Hodgkin's lymphoma; his last session was one week ago and he denies any complications from chemotherapy. He denies any other complaints at this time.  PCP: Dr. Wendi Snipes  Past Medical History:  Diagnosis Date  . Allergy   . Cancer [facility])    hodgkin lymphoma  . Familial multiple factor deficiency syndrome, type IV (HCC)    "Factor IV Leiden"    Patient Active Problem List   Diagnosis Date Noted  . Acute deep vein thrombosis (DVT) of left femoral vein ([place]) 09/22/2015  . Cough, persistent 09/22/2015  . Drug-induced neutropenia ([place]) 09/22/2015  . Tobacco abuse 08/11/2015  . Factor V Leiden ([place]) 07/19/2015  . 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 Inguinal lymphadenopathy 07/11/2015    Past Surgical History:  Procedure Laterality Date  . TONSILLECTOMY    . tubes in ears Bilateral       Home Medications    Prior to Admission medications   Medication Sig Start Date End Date Taking? Authorizing Provider  albuterol (PROVENTIL HFA;VENTOLIN HFA) 108 (90 Base) MCG/ACT inhaler Inhale 2 puffs into the lungs every 6 (six) hours as needed for wheezing or shortness of breath. Patient not taking: Reported on 09/29/2015 07/12/15  Yes Timmothy Euler, MD  dexamethasone (DECADRON) 4 MG tablet Take 4-8 mg by mouth See admin instructions. Pt is to take two tablets once daily on the day after chemo and then two tablets two times daily for two days.   Yes Historical Provider, MD  ibuprofen (ADVIL,MOTRIN) 200 MG tablet Take 400-600 mg by mouth every 6 (six) hours as needed for headache.   Yes Historical Provider, MD  lidocaine-prilocaine (EMLA) cream Apply 1 application topically as needed (prior to accessing port).   Yes Historical Provider, MD  ondansetron (ZOFRAN) 8 MG tablet Take 8 mg by mouth 2 (two) times daily as needed for nausea or vomiting.   Yes Historical Provider, MD  prochlorperazine (COMPAZINE) 10 MG tablet Take 10 mg by mouth every 6 (six) hours as needed for nausea or vomiting.   Yes Historical Provider, MD  Rivaroxaban 15 & 20 MG TBPK Take as directed on package: Start with one 15mg  tablet by mouth twice a day with food. On  Day 22, switch to one 20mg  tablet once a day with food. 09/19/15  Yes Duffy Bruce, MD  cephALEXin (KEFLEX) 500 MG capsule Take 1 capsule (500 mg total) by mouth 3 (three) times daily. 09/29/15   Heath Lark, MD    Family History Family History  Problem Relation Age of Onset  . Depression Mother   . Hypertension Father   . Diabetes Maternal Grandmother   . Diabetes Maternal Grandfather   . Cancer Maternal Grandfather     lung ca  . Diabetes Paternal Grandmother   . Diabetes Paternal Grandfather      Social History Social History  Substance Use Topics  . Smoking status: Current Every Day Smoker    Packs/day: 0.50    Years: 19.00    Types: Cigarettes  . Smokeless tobacco: Never Used  . Alcohol use Yes     Allergies   Review of patient's allergies indicates no known allergies.   Review of Systems Review of Systems  A complete 10 system review of systems was obtained and all systems are negative except as noted in the HPI and PMH.   Physical Exam Updated Vital Signs BP 121/76   Pulse 65   Temp 98.6 F (37 C) (Oral)   Resp 16   Ht 6' (1.829 m)   Wt 147 lb (66.7 kg)   SpO2 98%   BMI 19.94 kg/m   Physical Exam  Constitutional: He is oriented to person, place, and time. He appears well-developed and well-nourished. No distress.  HENT:  Head: Normocephalic and atraumatic.  Eyes: Conjunctivae and EOM are normal.  Neck: Neck supple. No tracheal deviation present.  Cardiovascular: Normal rate.   Pulmonary/Chest: Effort normal. No respiratory distress.  Musculoskeletal: Normal range of motion.  Neurological: He is alert and oriented to person, place, and time.  Skin: Skin is warm and dry.  Erythema over the medial aspect of the left thigh, which extends from the inguinal canal all the way to the left knee. No fluctuance with palpation. No induration. Skin is warm to touch.  Psychiatric: He has a normal mood and affect. His behavior is normal.  Nursing note and vitals reviewed.   ED Treatments / Results  Labs (all labs ordered are listed, but only abnormal results are displayed) Labs Reviewed  CBC WITH DIFFERENTIAL/PLATELET - Abnormal; Notable for the following:       Result Value   RBC 4.19 (*)    Hemoglobin 12.8 (*)    HCT 36.9 (*)    All other components within normal limits  BASIC METABOLIC PANEL - Abnormal; Notable for the following:    Glucose, Bld 102 (*)    All other components within normal limits  CULTURE, BLOOD (ROUTINE X 2)  CULTURE, BLOOD  (ROUTINE X 2)    EKG  EKG Interpretation None       Radiology No results found.  Procedures Procedures (including critical care time)  DIAGNOSTIC STUDIES: Oxygen Saturation is 100% on RA, normal by my interpretation.    COORDINATION OF CARE: 2:02 AM Discussed treatment plan with pt at bedside and pt agreed to plan.  Medications Ordered in ED Medications - No data to display   Initial Impression / Assessment and Plan / ED Course  I have reviewed the triage vital signs and the nursing notes.  Pertinent labs & imaging results that were available during my care of the patient were reviewed by me and considered in my medical decision making (see chart for details).  Clinical  Course    I personally performed the services described in this documentation, which was scribed in my presence. The recorded information has been reviewed and is accurate.  Pt with cancer hx, DVT comes in with rash in the leg. Likely cellulitis vs. Thrombophlebitis. He has cancer hx. And immunosuppressed.  Cellulitis, septic thrombophlebitis considered. Medicine consulted for admission.   Final Clinical Impressions(s) / ED Diagnoses   Final diagnoses:  Thrombophlebitis    New Prescriptions Discharge Medication List as of 09/29/2015  4:41 AM       Varney Biles, MD 10/01/15 [phone]

## 2015-09-29 NOTE — Telephone Encounter (Signed)
I can see him as an add on at 1245 pm today Please inform the scheduler

## 2015-09-29 NOTE — Discharge Instructions (Signed)
Continue Xarelto as prescribed Use ibuprofen 400-600 mg 2-3 times daily for the next 2-3 days, with food Use warm compresses as needed Close follow up with Oncology  Return to the ER if the rash is getting worse, there are fevers, severe pain.

## 2015-09-29 NOTE — Telephone Encounter (Signed)
Wife left message stating pt went to ED last night for complications with left leg. ED suggested patient see Dr Alvy Bimler today or ASAP.

## 2015-09-29 NOTE — Telephone Encounter (Signed)
Added apt per pof, pt aware

## 2015-09-29 NOTE — ED Triage Notes (Signed)
Pt. reports left inner thigh pain onset yesterday , denies injury , no fever or SOB .

## 2015-09-29 NOTE — Progress Notes (Signed)
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     07/28/2015 Pathology Results    Accession: [id number] core needle biopsy of the right supraclavicular region came back positive for classical Hodgkin lymphoma     08/22/2015 Bone Marrow Biopsy    Accession: [id number] Bone marrow is negative for involvement     08/22/2015 Procedure    He has port placement and bone marrow biopsy     08/23/2015 Imaging    ECHO showed normal EF     08/24/2015 PET scan    Bilateral hypermetabolic supraclavicular lymphadenopathy and mediastinal adenopathy consistent with known Hodgkin's lymphoma. 2. No disease is identified below the diaphragms.     08/25/2015 -  Chemotherapy    He is started on ABVD     09/19/2015 Imaging    Superficial thrombosis is noted in the left greater saphenous vein, from distal thigh extending up to groin. Non-occlusive DVT is extending into the left common femoral vein, at the   saphenofemoral junction.     09/22/2015 Adverse Reaction    Due to cough, Bleomycin is placed on hold      INTERVAL HISTORY: Please see below for problem oriented charting. He is seen urgently for evaluation. He went to the emergency department due to acute redness and warmth at prior site of DVT. He denies further cough. He has completed a course of antibiotics recently. He is compliant taking his anticoagulation treatment. The patient denies  any recent signs or symptoms of bleeding such as spontaneous epistaxis, hematuria or hematochezia.  REVIEW OF SYSTEMS:   Constitutional: Denies fevers, chills or abnormal weight loss Eyes: Denies blurriness of vision Ears, nose, mouth, throat, and face: Denies mucositis or sore throat Respiratory: Denies cough, dyspnea or wheezes Cardiovascular: Denies palpitation, chest discomfort or lower extremity swelling Gastrointestinal:  Denies nausea, heartburn or change in bowel habits Lymphatics: Denies new lymphadenopathy or easy bruising Neurological:Denies numbness, tingling or new weaknesses Behavioral/Psych: Mood is stable, no new changes  All other systems were reviewed with the patient and are negative.  I have reviewed the past medical history, past surgical history, social history and family history with the patient and they are unchanged from previous note.  ALLERGIES:  has No Known Allergies.  MEDICATIONS:  Current Outpatient Prescriptions  Medication Sig Dispense Refill  . dexamethasone (DECADRON) 4 MG tablet Take 4-8 mg by mouth See admin instructions. Pt is to take two tablets once daily on the day after chemo and then two tablets two times daily for two days.    Marland Kitchen ibuprofen (ADVIL,MOTRIN) 200 MG tablet Take 400-600 mg by mouth every 6 (six) hours as needed for headache.    . lidocaine-prilocaine (EMLA) cream Apply 1 application topically as needed (prior to accessing port).    . Rivaroxaban 15 & 20 MG TBPK Take as directed on package: Start with one 50m tablet by mouth twice a day with food. On Day 22, switch to one 235mtablet once a day with  food. 51 each 0  . albuterol (PROVENTIL HFA;VENTOLIN HFA) 108 (90 Base) MCG/ACT inhaler Inhale 2 puffs into the lungs every 6 (six) hours as needed for wheezing or shortness of breath. (Patient not taking: Reported on 09/29/2015) 1 Inhaler 0  . cephALEXin (KEFLEX) 500 MG capsule Take 1 capsule (500 mg total) by mouth 3 (three) times daily. 21  capsule 0  . ondansetron (ZOFRAN) 8 MG tablet Take 8 mg by mouth 2 (two) times daily as needed for nausea or vomiting.    . prochlorperazine (COMPAZINE) 10 MG tablet Take 10 mg by mouth every 6 (six) hours as needed for nausea or vomiting.     No current facility-administered medications for this visit.     PHYSICAL EXAMINATION: ECOG PERFORMANCE STATUS: 0 - Asymptomatic  Vitals:   09/29/15 1301  BP: 125/75  Pulse: 79  Resp: 18  Temp: 98.3 F (36.8 C)   Filed Weights   09/29/15 1301  Weight: 145 lb 1.6 oz (65.8 kg)    GENERAL:alert, no distress and comfortable SKIN: Noted skin rash around the site of thrombosis phlebitis EYES: normal, Conjunctiva are pink and non-injected, sclera clear NEURO: alert & oriented x 3 with fluent speech, no focal motor/sensory deficits  LABORATORY DATA:  I have reviewed the data as listed    Component Value Date/Time   NA 136 09/29/2015 0218   NA 140 09/22/2015 0816   K 3.9 09/29/2015 0218   K 3.8 09/22/2015 0816   CL 102 09/29/2015 0218   CO2 28 09/29/2015 0218   CO2 23 09/22/2015 0816   GLUCOSE 102 (H) 09/29/2015 0218   GLUCOSE 163 (H) 09/22/2015 0816   BUN 9 09/29/2015 0218   BUN 7.5 09/22/2015 0816   CREATININE 0.75 09/29/2015 0218   CREATININE 0.8 09/22/2015 0816   CALCIUM 9.3 09/29/2015 0218   CALCIUM 8.9 09/22/2015 0816   PROT 6.8 09/22/2015 0816   ALBUMIN 3.7 09/22/2015 0816   AST 19 09/22/2015 0816   ALT 23 09/22/2015 0816   ALKPHOS 61 09/22/2015 0816   BILITOT <0.30 09/22/2015 0816   GFRNONAA >60 09/29/2015 0218   GFRAA >60 09/29/2015 0218    No results found for: SPEP, UPEP  Lab Results  Component Value Date   WBC 5.0 09/29/2015   NEUTROABS 2.9 09/29/2015   HGB 12.8 (L) 09/29/2015   HCT 36.9 (L) 09/29/2015   MCV 88.1 09/29/2015   PLT 184 09/29/2015      Chemistry      Component Value Date/Time   NA 136 09/29/2015 0218   NA 140 09/22/2015 0816   K 3.9 09/29/2015 0218   K 3.8 09/22/2015 0816   CL 102  09/29/2015 0218   CO2 28 09/29/2015 0218   CO2 23 09/22/2015 0816   BUN 9 09/29/2015 0218   BUN 7.5 09/22/2015 0816   CREATININE 0.75 09/29/2015 0218   CREATININE 0.8 09/22/2015 0816      Component Value Date/Time   CALCIUM 9.3 09/29/2015 0218   CALCIUM 8.9 09/22/2015 0816   ALKPHOS 61 09/22/2015 0816   AST 19 09/22/2015 0816   ALT 23 09/22/2015 0816   BILITOT <0.30 09/22/2015 0816        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 The recent diagnosis of blood clot is unrelated. I will see him back in the near future for further evaluation  Acute deep vein thrombosis (DVT) of  left femoral vein ([place]) The patient had recent diagnosis of blood clot. He is compliant taking his anticoagulation therapy without any complications. The abnormal appearance on his leg is likely due to superficial thrombophlebitis. I make a mark around the site and educated the patient to watch for any signs of spread/cellulitis. I gave him a prescription of antibiotics to hang onto in case if it looks like he is developing into cellulitis, he can take the prescription right away For now, I recommend he take ibuprofen   No orders of the defined types were placed in this encounter.  All questions were answered. The patient knows to call the clinic with any problems, questions or concerns. No barriers to learning was detected. I spent 15 minutes counseling the patient face to face. The total time spent in the appointment was 20 minutes and more than 50% was on counseling and review of test results     [facility], [place], MD 09/30/2015 5:01 PM

## 2015-09-29 NOTE — Consult Note (Signed)
Hospitalist Service Medical Consultation   Ross Hansen  [id number]  DOB: 08-Mar-1985  DOA: 09/29/2015  PCP: Kenn File, MD   Outpatient Specialists: Heath Lark, Oncology   Requesting physician: Varney Biles, MD  Reason for consultation: Thrombophlebitis   History of Present Illness: Ross Hansen is an 30 y.o. male with Hodgkin's lymphoma, currently on chemo, hx of Factor V Leiden, recently diagnosed L leg DVT now on Xarelto who presents with worsening pain and streaky redness in the leg.  The patient has now completed 3 cycles of ABVD chemotherapy for Hodgkin's lymphoma.  2 weeks ago between the 2nd and 3rd cycle, he had some LEFT leg pain and streaky inner thigh redness and pain, so he went to the ER for concern for DVT (his father has had recurrent DVT and the patient's shares factor V Leiden trait although he has never before personally had a DVT).  He was clinically thought to have a cellulitis not thrombus and was started on doxycycline (there was just some mild redness and streaking), but there was enough suspicion for alternative diagnosis of DVT that he had follow up LE [place].  This ultrasound showed extensive superficial thrombosis as well as some non-occlusive deep thrombus, and so he was started on Xarelto.    The superficial redness did improve slowly over about the next 2-3 days.  He was also taking doxycycline as prescribed during this time, which he completed last Sunday.  Now, two days ago he started to notice some inner thigh pain again. Today this pain progressed and so he was limping with walking, and his wife asked him about it and found that he had again streaky redness on the inner thigh and some tenderness to palpation.  They called the Onc triage line tonight, who recommended urgent evaluation so they came to the ER.  At no point did he have swelling, edema.  At no point fever, malaise, chills, nausea, weakness.  ED  course: -Afebrile, heart rate and respirations normal, blood pressure 120/90, oxygen saturation 100% on room air -Sodium 136, potassium 3.9, creatinine 0.75, WBC 5K, hemoglobin 12.8  Given recurrence in relation to taking doxycycline, there was concern that the redness again reflected infection, and TRH were asked to evaluate in consultation.      Review of Systems:  Review of Systems  Constitutional: Negative for chills, diaphoresis, fever and malaise/fatigue.  HENT: Negative.   Eyes: Negative.   Respiratory: Negative for cough, hemoptysis, sputum production, shortness of breath and wheezing.   Cardiovascular: Negative for leg swelling.  Gastrointestinal: Positive for constipation. Negative for blood in stool and melena.  Genitourinary: Negative.   Musculoskeletal: Positive for myalgias.  Skin: Positive for rash.  Neurological: Negative.  Negative for weakness.  Endo/Heme/Allergies: Negative.   Psychiatric/Behavioral: Negative.         Past Medical History: Past Medical History:  Diagnosis Date  . Allergy   . Cancer [facility])    hodgkin lymphoma  . Familial multiple factor deficiency syndrome, type IV (HCC)    "Factor IV Leiden"    Past Surgical History: Past Surgical History:  Procedure Laterality Date  . TONSILLECTOMY    . tubes in ears Bilateral      Allergies:  No Known Allergies   Social History: Works for a [employer]. Currently on office duty. Lives with his wife and 3 children. Former smoker, currently smoking only to 3 cigarettes per day.  Family History: Family History  Problem Relation Age of Onset  . Depression Mother   . Hypertension Father   . Diabetes Maternal Grandmother   . Diabetes Maternal Grandfather   . Cancer Maternal Grandfather     lung ca  . Diabetes Paternal Grandmother   . Diabetes Paternal Grandfather   Father, factor V trait.   Physical Exam: Vitals:   09/29/15 0113 09/29/15 0135 09/29/15 0300  BP: 119/88 131/82  123/87  Pulse: 74 70 70  Resp: 16 12 16   Temp: 98.6 F (37 C)    TempSrc: Oral    SpO2: 100% 100% 97%  Weight: 66.7 kg (147 lb)    Height: 6' (1.829 m)      Constitutional: Alert and awake, oriented x3, not in any acute distress. Eyes: PERLA, EOMI, irises appear normal, anicteric sclera,  ENMT: head shaved, external ears and nose appear normal, hearing normal            Lips appears normal, oropharynx mucosa, tongue, posterior pharynx appear normal  Neck: neck appears normal, no masses, normal ROM, no thyromegaly, no JVD  Lymph: No cervical, axillary, inguinal lymphadenopathy that I appreciate. CVS: S1-S2 clear, no murmur rubs or gallops, no LE edema  Respiratory:  clear to auscultation bilaterally, no wheezing, rales or rhonchi. Respiratory effort normal. No accessory muscle use.  GI: soft nontender, nondistended, normal bowel sounds, no hepatosplenomegaly, no hernias  Musculoskeletal: no cyanosis, clubbing or edema noted bilaterally Neuro: Cranial nerves II-XII intact, strength, sensation, reflexes Psych: judgement and insight appear normal, stable mood and affect, mental status Skin: The left leg as a whole is normal without swelling or redness.  On the left inner thigh, there is a short segment of red inflamed skin.  This is without swelling or induration, but is very slightly tender to touch at one end.  There is no drainage or fluctuance or firm areas.  There is no surrounding redness or swelling.       Data reviewed:  I have personally reviewed following labs and imaging studies Labs:  CBC:  Recent Labs Lab 09/22/15 0816 09/29/15 0218  WBC 3.2* 5.0  NEUTROABS 1.1* 2.9  HGB 14.1 12.8*  HCT 40.0 36.9*  MCV 88.1 88.1  PLT 191 [id number]    Basic Metabolic Panel:  Recent Labs Lab 09/22/15 0816 09/29/15 0218  NA 140 136  K 3.8 3.9  CL  --  102  CO2 23 28  GLUCOSE 163* 102*  BUN 7.5 9  CREATININE 0.8 0.75  CALCIUM 8.9 9.3   GFR Estimated Creatinine Clearance:  126.2 mL/min (by C-G formula based on SCr of 0.8 mg/dL). Liver Function Tests:  Recent Labs Lab 09/22/15 0816  AST 19  ALT 23  ALKPHOS 61  BILITOT <0.30  PROT 6.8  ALBUMIN 3.7      Impression/Recommendations 1. Thrombophlebitis: This appears to be a superficial thrombophlebitis.   -He was thought initially a week ago to have cellulitis more than DVT, but this turned out to be incorrect.   -He completed doxycycline anyway, and I attribute his improved redness and pain to normal expected course of thrombophlebitis, rather than response to antibiotics -Blood cultures were drawn, which is reasonable as the patient is at higher than average risk for infection in setting of Hodgkin's and indwelling line and recent neutropenia, but this would be an atypical presentation for suppurative thrombophlebitis and there is no sign of cellulitis currently -No current systemic signs of infection, antibiotics are deferred   Continue  Xarelto as prescribed Use ibuprofen 400-600 mg 2-3 times daily for the next 2-3 days, with food Use warm compresses as needed Close follow up with Oncology RN by telephone Return to ER for fever, chills, confusion     Thank you for this consultation.  Our [facility] hospitalist team will follow the patient with you.     Edwin Dada M.D. [facility] 09/29/2015, 3:37 AM

## 2015-09-30 ENCOUNTER — Encounter: Payer: Self-pay | Admitting: Hematology and Oncology

## 2015-09-30 NOTE — Assessment & Plan Note (Signed)
The patient had recent diagnosis of blood clot. He is compliant taking his anticoagulation therapy without any complications. The abnormal appearance on his leg is likely due to superficial thrombophlebitis. I make a mark around the site and educated the patient to watch for any signs of spread/cellulitis. I gave him a prescription of antibiotics to hang onto in case if it looks like he is developing into cellulitis, he can take the prescription right away For now, I recommend he take ibuprofen

## 2015-09-30 NOTE — Assessment & Plan Note (Signed)
Overall, he tolerated treatment well without excessive side effects. The recent diagnosis of blood clot is unrelated. I will see him back in the near future for further evaluation

## 2015-10-04 LAB — CULTURE, BLOOD (ROUTINE X 2)
CULTURE: NO GROWTH
CULTURE: NO GROWTH

## 2015-10-06 ENCOUNTER — Telehealth: Payer: Self-pay | Admitting: Hematology and Oncology

## 2015-10-06 ENCOUNTER — Other Ambulatory Visit (HOSPITAL_BASED_OUTPATIENT_CLINIC_OR_DEPARTMENT_OTHER): Payer: No Typology Code available for payment source

## 2015-10-06 ENCOUNTER — Ambulatory Visit: Payer: No Typology Code available for payment source

## 2015-10-06 ENCOUNTER — Ambulatory Visit (HOSPITAL_BASED_OUTPATIENT_CLINIC_OR_DEPARTMENT_OTHER): Payer: No Typology Code available for payment source | Admitting: Hematology and Oncology

## 2015-10-06 ENCOUNTER — Ambulatory Visit (HOSPITAL_BASED_OUTPATIENT_CLINIC_OR_DEPARTMENT_OTHER): Payer: No Typology Code available for payment source

## 2015-10-06 VITALS — BP 127/62 | HR 69 | Temp 98.1°F | Resp 18 | Wt 152.9 lb

## 2015-10-06 DIAGNOSIS — R05 Cough: Secondary | ICD-10-CM

## 2015-10-06 DIAGNOSIS — D702 Other drug-induced agranulocytosis: Secondary | ICD-10-CM

## 2015-10-06 DIAGNOSIS — J449 Chronic obstructive pulmonary disease, unspecified: Secondary | ICD-10-CM

## 2015-10-06 DIAGNOSIS — Z720 Tobacco use: Secondary | ICD-10-CM

## 2015-10-06 DIAGNOSIS — Z5111 Encounter for antineoplastic chemotherapy: Secondary | ICD-10-CM | POA: Diagnosis not present

## 2015-10-06 DIAGNOSIS — C8118 Nodular sclerosis classical Hodgkin lymphoma, lymph nodes of multiple sites: Secondary | ICD-10-CM | POA: Diagnosis not present

## 2015-10-06 DIAGNOSIS — R053 Chronic cough: Secondary | ICD-10-CM

## 2015-10-06 DIAGNOSIS — I82412 Acute embolism and thrombosis of left femoral vein: Secondary | ICD-10-CM | POA: Diagnosis not present

## 2015-10-06 DIAGNOSIS — Z95828 Presence of other vascular implants and grafts: Secondary | ICD-10-CM

## 2015-10-06 LAB — CBC WITH DIFFERENTIAL/PLATELET
BASO%: 1.3 % (ref 0.0–2.0)
Basophils Absolute: 0.1 10*3/uL (ref 0.0–0.1)
EOS ABS: 0.2 10*3/uL (ref 0.0–0.5)
EOS%: 4.9 % (ref 0.0–7.0)
HCT: 38.9 % (ref 38.4–49.9)
HEMOGLOBIN: 13.5 g/dL (ref 13.0–17.1)
LYMPH#: 1 10*3/uL (ref 0.9–3.3)
LYMPH%: 25.7 % (ref 14.0–49.0)
MCH: 30.9 pg (ref 27.2–33.4)
MCHC: 34.7 g/dL (ref 32.0–36.0)
MCV: 89 fL (ref 79.3–98.0)
MONO#: 0.8 10*3/uL (ref 0.1–0.9)
MONO%: 20 % — AB (ref 0.0–14.0)
NEUT%: 48.1 % (ref 39.0–75.0)
NEUTROS ABS: 1.9 10*3/uL (ref 1.5–6.5)
Platelets: 184 10*3/uL (ref 140–400)
RBC: 4.37 10*6/uL (ref 4.20–5.82)
RDW: 13.9 % (ref 11.0–14.6)
WBC: 3.9 10*3/uL — AB (ref 4.0–10.3)

## 2015-10-06 LAB — COMPREHENSIVE METABOLIC PANEL
ALBUMIN: 3.5 g/dL (ref 3.5–5.0)
ALK PHOS: 63 U/L (ref 40–150)
ALT: 28 U/L (ref 0–55)
AST: 21 U/L (ref 5–34)
Anion Gap: 8 mEq/L (ref 3–11)
BUN: 4.1 mg/dL — AB (ref 7.0–26.0)
CO2: 25 mEq/L (ref 22–29)
Calcium: 9 mg/dL (ref 8.4–10.4)
Chloride: 108 mEq/L (ref 98–109)
Creatinine: 0.8 mg/dL (ref 0.7–1.3)
EGFR: >90 mL/min/{1.73_m2} (ref >90)
GLUCOSE: 103 mg/dL (ref 70–140)
Potassium: 3.7 mEq/L (ref 3.5–5.1)
SODIUM: 141 meq/L (ref 136–145)
TOTAL PROTEIN: 6.5 g/dL (ref 6.4–8.3)

## 2015-10-06 MED ORDER — DOXORUBICIN HCL CHEMO IV INJECTION 2 MG/ML
25.0000 mg/m2 | Freq: Once | INTRAVENOUS | Status: AC
  Administered 2015-10-06: 46 mg via INTRAVENOUS
  Filled 2015-10-06: qty 23

## 2015-10-06 MED ORDER — SODIUM CHLORIDE 0.9 % IV SOLN
Freq: Once | INTRAVENOUS | Status: AC
  Administered 2015-10-06: 10:00:00 via INTRAVENOUS
  Filled 2015-10-06: qty 5

## 2015-10-06 MED ORDER — VINBLASTINE SULFATE CHEMO INJECTION 1 MG/ML
5.9500 mg/m2 | Freq: Once | INTRAVENOUS | Status: DC
  Filled 2015-10-06: qty 11

## 2015-10-06 MED ORDER — RIVAROXABAN 20 MG PO TABS: 20.0000 mg | ORAL_TABLET | Freq: Every day | ORAL | 9 refills | Status: DC

## 2015-10-06 MED ORDER — SODIUM CHLORIDE 0.9 % IV SOLN
375.0000 mg/m2 | Freq: Once | INTRAVENOUS | Status: AC
  Administered 2015-10-06: 700 mg via INTRAVENOUS
  Filled 2015-10-06: qty 35

## 2015-10-06 MED ORDER — HEPARIN SOD (PORK) LOCK FLUSH 100 UNIT/ML IV SOLN
500.0000 [IU] | Freq: Once | INTRAVENOUS | Status: AC | PRN
  Administered 2015-10-06: 500 [IU]
  Filled 2015-10-06: qty 5

## 2015-10-06 MED ORDER — SODIUM CHLORIDE 0.9 % IJ SOLN
10.0000 mL | INTRAMUSCULAR | Status: DC | PRN
  Administered 2015-10-06: 10 mL via INTRAVENOUS
  Filled 2015-10-06: qty 10

## 2015-10-06 MED ORDER — SODIUM CHLORIDE 0.9 % IV SOLN
Freq: Once | INTRAVENOUS | Status: AC
  Administered 2015-10-06: 10:00:00 via INTRAVENOUS

## 2015-10-06 MED ORDER — SODIUM CHLORIDE 0.9% FLUSH
10.0000 mL | INTRAVENOUS | Status: DC | PRN
  Administered 2015-10-06: 10 mL
  Filled 2015-10-06: qty 10

## 2015-10-06 MED ORDER — PALONOSETRON HCL INJECTION 0.25 MG/5ML
INTRAVENOUS | Status: AC
  Filled 2015-10-06: qty 5

## 2015-10-06 MED ORDER — PALONOSETRON HCL INJECTION 0.25 MG/5ML
0.2500 mg | Freq: Once | INTRAVENOUS | Status: AC
  Administered 2015-10-06: 0.25 mg via INTRAVENOUS

## 2015-10-06 MED FILL — DEXAMETHASONE 4 MG TABLET: 4 | 30 days supply | Qty: 30 | Fill #1

## 2015-10-06 NOTE — Assessment & Plan Note (Signed)
This is likely due to recent treatment. The patient denies recent history of fevers, cough, chills, diarrhea or dysuria. He is asymptomatic from the leukopenia. I will observe for now.  I will continue the chemotherapy at current dose without dosage adjustment.  If the leukopenia gets progressive worse in the future, I might have to delay his treatment or adjust the chemotherapy dose. 

## 2015-10-06 NOTE — Telephone Encounter (Signed)
Gave patient avs report and appointments for August and September. Central radiology scheduling will contact patient re pet scan - patient aware.

## 2015-10-06 NOTE — Assessment & Plan Note (Signed)
Overall, he tolerated treatment well without excessive side effects. The recent diagnosis of blood clot is unrelated. I am a little concerned about the mild pulmonary congestion. It could very well be due to minor bronchitis from COPD/smoking. I would like to hold bleomycin for this cycle and reassess in 2 weeks. Despite mild leukopenia, I will proceed with treatment without delay or dose adjustment

## 2015-10-06 NOTE — Assessment & Plan Note (Signed)
He has mild cough with congestion. He had recent antibiotic therapy. Chest x-ray is negative but I will continue to hold current cycle of bleomycin

## 2015-10-06 NOTE — Patient Instructions (Signed)
[facility] Discharge Instructions for Patients Receiving Chemotherapy  Today you received the following chemotherapy agents Doxyrubicin, Vincristine, DTIC  To help prevent nausea and vomiting after your treatment, we encourage you to take your nausea medication    If you develop nausea and vomiting that is not controlled by your nausea medication, call the clinic.   BELOW ARE SYMPTOMS THAT SHOULD BE REPORTED IMMEDIATELY:  *FEVER GREATER THAN 100.5 F  *CHILLS WITH OR WITHOUT FEVER  NAUSEA AND VOMITING THAT IS NOT CONTROLLED WITH YOUR NAUSEA MEDICATION  *UNUSUAL SHORTNESS OF BREATH  *UNUSUAL BRUISING OR BLEEDING  TENDERNESS IN MOUTH AND THROAT WITH OR WITHOUT PRESENCE OF ULCERS  *URINARY PROBLEMS  *BOWEL PROBLEMS  UNUSUAL RASH Items with * indicate a potential emergency and should be followed up as soon as possible.  Feel free to call the clinic you have any questions or concerns. The clinic phone number is [phone].  Please show the [place] at check-in to the Emergency Department and triage nurse.

## 2015-10-06 NOTE — Progress Notes (Signed)
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     07/28/2015 Pathology Results    Accession: [id number] core needle biopsy of the right supraclavicular region came back positive for classical Hodgkin lymphoma     08/22/2015 Bone Marrow Biopsy    Accession: [id number] Bone marrow is negative for involvement     08/22/2015 Procedure    He has port placement and bone marrow biopsy     08/23/2015 Imaging    ECHO showed normal EF     08/24/2015 PET scan    Bilateral hypermetabolic supraclavicular lymphadenopathy and mediastinal adenopathy consistent with known Hodgkin's lymphoma. 2. No disease is identified below the diaphragms.     08/25/2015 -  Chemotherapy    He is started on ABVD     09/19/2015 Imaging    Superficial thrombosis is noted in the left greater saphenous vein, from distal thigh extending up to groin. Non-occlusive DVT is extending into the left common femoral vein, at the   saphenofemoral junction.     09/22/2015 Adverse Reaction    Due to cough, Bleomycin is placed on hold      INTERVAL HISTORY: Please see below for problem oriented charting. He returns prior to cycle 4 of treatment The patient denies any recent signs or symptoms of bleeding such as spontaneous epistaxis, hematuria or hematochezia. His skin rash had resolved He has no new lymphadenopathy. He has occasional dry cough but improved compared to prior  visit  REVIEW OF SYSTEMS:   Constitutional: Denies fevers, chills or abnormal weight loss Eyes: Denies blurriness of vision Ears, nose, mouth, throat, and face: Denies mucositis or sore throat Cardiovascular: Denies palpitation, chest discomfort or lower extremity swelling Gastrointestinal:  Denies nausea, heartburn or change in bowel habits Skin: Denies abnormal skin rashes Lymphatics: Denies new lymphadenopathy or easy bruising Neurological:Denies numbness, tingling or new weaknesses Behavioral/Psych: Mood is stable, no new changes  All other systems were reviewed with the patient and are negative.  I have reviewed the past medical history, past surgical history, social history and family history with the patient and they are unchanged from previous note.  ALLERGIES:  has No Known Allergies.  MEDICATIONS:  Current Outpatient Prescriptions  Medication Sig Dispense Refill  . dexamethasone (DECADRON) 4 MG tablet Take 4-8 mg by mouth See admin instructions. Pt is to take two tablets once daily on the day after chemo and then two tablets two times daily for two days.    Marland Kitchen ibuprofen (ADVIL,MOTRIN) 200 MG tablet Take 400-600 mg by mouth every 6 (six) hours as needed for headache.    . lidocaine-prilocaine (EMLA) cream Apply 1 application topically as needed (prior to accessing port).    . ondansetron (ZOFRAN) 8 MG tablet Take 8 mg by mouth 2 (two) times daily as needed for nausea or vomiting.    . prochlorperazine (COMPAZINE) 10 MG tablet Take 10 mg by mouth every 6 (six) hours as needed for nausea or vomiting.    . Rivaroxaban  15 & 20 MG TBPK Take as directed on package: Start with one 88m tablet by mouth twice a day with food. On Day 22, switch to one 297mtablet once a day with food. 51 each 0  . cephALEXin (KEFLEX) 500 MG capsule Take 1 capsule (500 mg total) by mouth 3 (three) times daily. 21 capsule 0  . rivaroxaban (XARELTO) 20 MG TABS tablet Take 1 tablet (20 mg total) by mouth daily  with supper. 30 tablet 9   No current facility-administered medications for this visit.    Facility-Administered Medications Ordered in Other Visits  Medication Dose Route Frequency Provider Last Rate Last Dose  . dacarbazine (DTIC) 700 mg in sodium chloride 0.9 % 250 mL chemo infusion  375 mg/m2 (Treatment Plan Recorded) Intravenous Once NiHeath LarkMD      . DOXOrubicin (ADRIAMYCIN) chemo injection 46 mg  25 mg/m2 (Treatment Plan Recorded) Intravenous Once NiHeath LarkMD      . heparin lock flush 100 unit/mL  500 Units Intracatheter Once PRN NiHeath LarkMD      . palonosetron (ALOXI) injection 0.25 mg  0.25 mg Intravenous Once NiHeath LarkMD      . sodium chloride flush (NS) 0.9 % injection 10 mL  10 mL Intracatheter PRN NiHeath LarkMD      . vinBLAStine (VELBAN) 11 mg in sodium chloride 0.9 % 50 mL chemo infusion  5.95 mg/m2 (Treatment Plan Recorded) Intravenous Once NiHeath LarkMD        PHYSICAL EXAMINATION: ECOG PERFORMANCE STATUS: 0 - Asymptomatic  Vitals:   10/06/15 0900  BP: 127/62  Pulse: 69  Resp: 18  Temp: 98.1 F (36.7 C)   Filed Weights   10/06/15 0900  Weight: 152 lb 14.4 oz (69.4 kg)    GENERAL:alert, no distress and comfortable SKIN: skin color, texture, turgor are normal, no rashes or significant lesions EYES: normal, Conjunctiva are pink and non-injected, sclera clear OROPHARYNX:no exudate, no erythema and lips, buccal mucosa, and tongue normal  NECK: supple, thyroid normal size, non-tender, without nodularity LYMPH:  no palpable lymphadenopathy in the cervical, axillary or inguinal LUNGS: clear to auscultation and percussion with normal breathing effort HEART: regular rate & rhythm and no murmurs and no lower extremity edema ABDOMEN:abdomen soft, non-tender and normal bowel sounds Musculoskeletal:no cyanosis of digits and no clubbing  NEURO: alert & oriented x 3 with fluent speech, no focal motor/sensory deficits  LABORATORY DATA:  I have reviewed the  data as listed    Component Value Date/Time   NA 141 10/06/2015 0835   K 3.7 10/06/2015 0835   CL 102 09/29/2015 0218   CO2 25 10/06/2015 0835   GLUCOSE 103 10/06/2015 0835   BUN 4.1 (L) 10/06/2015 0835   CREATININE 0.8 10/06/2015 0835   CALCIUM 9.0 10/06/2015 0835   PROT 6.5 10/06/2015 0835   ALBUMIN 3.5 10/06/2015 0835   AST 21 10/06/2015 0835   ALT 28 10/06/2015 0835   ALKPHOS 63 10/06/2015 0835   BILITOT <0.30 10/06/2015 0835   GFRNONAA >60 09/29/2015 0218   GFRAA >60 09/29/2015 0218    No results found for: SPEP, UPEP  Lab Results  Component Value Date   WBC 3.9 (L) 10/06/2015   NEUTROABS 1.9 10/06/2015   HGB 13.5 10/06/2015   HCT 38.9 10/06/2015   MCV 89.0 10/06/2015   PLT 184 10/06/2015      Chemistry      Component Value Date/Time   NA 141 10/06/2015 0835   K  3.7 10/06/2015 0835   CL 102 09/29/2015 0218   CO2 25 10/06/2015 0835   BUN 4.1 (L) 10/06/2015 0835   CREATININE 0.8 10/06/2015 0835      Component Value Date/Time   CALCIUM 9.0 10/06/2015 0835   ALKPHOS 63 10/06/2015 0835   AST 21 10/06/2015 0835   ALT 28 10/06/2015 0835   BILITOT <0.30 10/06/2015 0835     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 The recent diagnosis of blood clot is unrelated. I am a little concerned about the mild pulmonary congestion. It could very well be due to minor bronchitis from COPD/smoking. I would like to hold bleomycin for this cycle and reassess in 2 weeks. Despite mild leukopenia, I will proceed with treatment without delay or dose adjustment  Drug-induced neutropenia ([place]) This is likely due to recent treatment. The patient denies recent history of fevers, cough, chills, diarrhea or dysuria. He is asymptomatic from the leukopenia. I will observe for now.  I will continue the chemotherapy at current dose without dosage adjustment.   If the leukopenia gets progressive worse in the future, I might have to delay his treatment or adjust the chemotherapy dose.    Acute deep vein thrombosis (DVT) of left femoral vein ([facility]) The patient had recent diagnosis of blood clot. He is compliant taking his anticoagulation therapy without any complications.  Cough, persistent He has mild cough with congestion. He had recent antibiotic therapy. Chest x-ray is negative but I will continue to hold current cycle of bleomycin  Tobacco abuse I spent some time counseling the patient the importance of tobacco cessation. he is currently attempting to quit on his own    Orders Placed This Encounter  Procedures  . NM PET Image Restag (PS) Skull Base To Thigh    Standing Status:   Future    Standing Expiration Date:   11/09/2016    Order Specific Question:   Reason for exam:    Answer:   staging lymphoma, assess response to Rx    Order Specific Question:   Preferred imaging location?    Answer:   [facility]   All questions were answered. The patient knows to call the clinic with any problems, questions or concerns. No barriers to learning was detected. I spent 25 minutes counseling the patient face to face. The total time spent in the appointment was 30 minutes and more than 50% was on counseling and review of test results     [facility], [place], MD 10/06/2015 10:57 AM

## 2015-10-06 NOTE — Assessment & Plan Note (Signed)
I spent some time counseling the patient the importance of tobacco cessation. he is currently attempting to quit on his own 

## 2015-10-06 NOTE — Assessment & Plan Note (Signed)
The patient had recent diagnosis of blood clot. He is compliant taking his anticoagulation therapy without any complications.

## 2015-10-14 ENCOUNTER — Ambulatory Visit (HOSPITAL_COMMUNITY)
Admission: RE | Admit: 2015-10-14 | Discharge: 2015-10-14 | Disposition: A | Discharge status: Home / Self Care | Payer: No Typology Code available for payment source | Source: Ambulatory Visit | Attending: Hematology and Oncology | Admitting: Hematology and Oncology

## 2015-10-14 DIAGNOSIS — C8118 Nodular sclerosis classical Hodgkin lymphoma, lymph nodes of multiple sites: Secondary | ICD-10-CM | POA: Insufficient documentation

## 2015-10-14 LAB — GLUCOSE, CAPILLARY: GLUCOSE-CAPILLARY: 91 mg/dL (ref 65–99)

## 2015-10-14 MED ORDER — FLUDEOXYGLUCOSE F - 18 (FDG) INJECTION
7.3700 | Freq: Once | INTRAVENOUS | Status: AC | PRN
  Administered 2015-10-14: 7.37 via INTRAVENOUS

## 2015-10-20 ENCOUNTER — Ambulatory Visit (HOSPITAL_BASED_OUTPATIENT_CLINIC_OR_DEPARTMENT_OTHER): Payer: No Typology Code available for payment source | Admitting: Hematology and Oncology

## 2015-10-20 ENCOUNTER — Ambulatory Visit (HOSPITAL_BASED_OUTPATIENT_CLINIC_OR_DEPARTMENT_OTHER): Payer: No Typology Code available for payment source

## 2015-10-20 ENCOUNTER — Encounter: Payer: Self-pay | Admitting: Hematology and Oncology

## 2015-10-20 ENCOUNTER — Other Ambulatory Visit (HOSPITAL_BASED_OUTPATIENT_CLINIC_OR_DEPARTMENT_OTHER): Payer: No Typology Code available for payment source

## 2015-10-20 ENCOUNTER — Ambulatory Visit: Payer: No Typology Code available for payment source

## 2015-10-20 DIAGNOSIS — I82412 Acute embolism and thrombosis of left femoral vein: Secondary | ICD-10-CM

## 2015-10-20 DIAGNOSIS — Z5111 Encounter for antineoplastic chemotherapy: Secondary | ICD-10-CM | POA: Diagnosis not present

## 2015-10-20 DIAGNOSIS — F411 Generalized anxiety disorder: Secondary | ICD-10-CM | POA: Insufficient documentation

## 2015-10-20 DIAGNOSIS — R05 Cough: Secondary | ICD-10-CM

## 2015-10-20 DIAGNOSIS — Z720 Tobacco use: Secondary | ICD-10-CM

## 2015-10-20 DIAGNOSIS — C8118 Nodular sclerosis classical Hodgkin lymphoma, lymph nodes of multiple sites: Secondary | ICD-10-CM

## 2015-10-20 DIAGNOSIS — D702 Other drug-induced agranulocytosis: Secondary | ICD-10-CM | POA: Diagnosis not present

## 2015-10-20 DIAGNOSIS — Z95828 Presence of other vascular implants and grafts: Secondary | ICD-10-CM

## 2015-10-20 DIAGNOSIS — R053 Chronic cough: Secondary | ICD-10-CM

## 2015-10-20 DIAGNOSIS — F419 Anxiety disorder, unspecified: Secondary | ICD-10-CM

## 2015-10-20 LAB — COMPREHENSIVE METABOLIC PANEL
ALK PHOS: 69 U/L (ref 40–150)
ALT: 25 U/L (ref 0–55)
ANION GAP: 9 meq/L (ref 3–11)
AST: 19 U/L (ref 5–34)
Albumin: 3.7 g/dL (ref 3.5–5.0)
BILIRUBIN TOTAL: 0.46 mg/dL (ref 0.20–1.20)
BUN: 6.5 mg/dL — ABNORMAL LOW (ref 7.0–26.0)
CHLORIDE: 107 meq/L (ref 98–109)
CO2: 25 meq/L (ref 22–29)
CREATININE: 0.8 mg/dL (ref 0.7–1.3)
Calcium: 9.3 mg/dL (ref 8.4–10.4)
Glucose: 100 mg/dl (ref 70–140)
POTASSIUM: 3.9 meq/L (ref 3.5–5.1)
SODIUM: 141 meq/L (ref 136–145)
Total Protein: 6.7 g/dL (ref 6.4–8.3)

## 2015-10-20 LAB — CBC WITH DIFFERENTIAL/PLATELET
BASO%: 1.7 % (ref 0.0–2.0)
BASOS ABS: 0.1 10*3/uL (ref 0.0–0.1)
EOS ABS: 0.2 10*3/uL (ref 0.0–0.5)
EOS%: 4.9 % (ref 0.0–7.0)
HCT: 39.4 % (ref 38.4–49.9)
HGB: 13.7 g/dL (ref 13.0–17.1)
LYMPH%: 27.5 % (ref 14.0–49.0)
MCH: 31.1 pg (ref 27.2–33.4)
MCHC: 34.8 g/dL (ref 32.0–36.0)
MCV: 89.3 fL (ref 79.3–98.0)
MONO#: 1 10*3/uL — ABNORMAL HIGH (ref 0.1–0.9)
MONO%: 30.1 % — AB (ref 0.0–14.0)
NEUT%: 35.8 % — AB (ref 39.0–75.0)
NEUTROS ABS: 1.2 10*3/uL — AB (ref 1.5–6.5)
PLATELETS: 190 10*3/uL (ref 140–400)
RBC: 4.41 10*6/uL (ref 4.20–5.82)
RDW: 14.2 % (ref 11.0–14.6)
WBC: 3.5 10*3/uL — AB (ref 4.0–10.3)
lymph#: 1 10*3/uL (ref 0.9–3.3)

## 2015-10-20 MED ORDER — DOXORUBICIN HCL CHEMO IV INJECTION 2 MG/ML
25.0000 mg/m2 | Freq: Once | INTRAVENOUS | Status: AC
  Administered 2015-10-20: 46 mg via INTRAVENOUS
  Filled 2015-10-20: qty 23

## 2015-10-20 MED ORDER — SODIUM CHLORIDE 0.9% FLUSH
10.0000 mL | INTRAVENOUS | Status: DC | PRN
  Administered 2015-10-20: 10 mL via INTRAVENOUS
  Filled 2015-10-20: qty 10

## 2015-10-20 MED ORDER — SODIUM CHLORIDE 0.9 % IV SOLN
Freq: Once | INTRAVENOUS | Status: AC
  Administered 2015-10-20: 10:00:00 via INTRAVENOUS

## 2015-10-20 MED ORDER — PALONOSETRON HCL INJECTION 0.25 MG/5ML
0.2500 mg | Freq: Once | INTRAVENOUS | Status: AC
  Administered 2015-10-20: 0.25 mg via INTRAVENOUS

## 2015-10-20 MED ORDER — LORAZEPAM 0.5 MG PO TABS: 0.5000 mg | ORAL_TABLET | Freq: Two times a day (BID) | ORAL | 0 refills | Status: DC | PRN

## 2015-10-20 MED ORDER — SODIUM CHLORIDE 0.9 % IV SOLN
5.9500 mg/m2 | Freq: Once | INTRAVENOUS | Status: AC
  Administered 2015-10-20: 11 mg via INTRAVENOUS
  Filled 2015-10-20: qty 11

## 2015-10-20 MED ORDER — SODIUM CHLORIDE 0.9% FLUSH
10.0000 mL | INTRAVENOUS | Status: DC | PRN
  Administered 2015-10-20: 10 mL
  Filled 2015-10-20: qty 10

## 2015-10-20 MED ORDER — PALONOSETRON HCL INJECTION 0.25 MG/5ML
INTRAVENOUS | Status: AC
Start: 2015-10-20 — End: 2015-10-20
  Filled 2015-10-20: qty 5

## 2015-10-20 MED ORDER — SODIUM CHLORIDE 0.9 % IV SOLN
Freq: Once | INTRAVENOUS | Status: AC
  Administered 2015-10-20: 10:00:00 via INTRAVENOUS
  Filled 2015-10-20: qty 5

## 2015-10-20 MED ORDER — HEPARIN SOD (PORK) LOCK FLUSH 100 UNIT/ML IV SOLN
500.0000 [IU] | Freq: Once | INTRAVENOUS | Status: AC | PRN
  Administered 2015-10-20: 500 [IU]
  Filled 2015-10-20: qty 5

## 2015-10-20 MED ORDER — SODIUM CHLORIDE 0.9 % IV SOLN
375.0000 mg/m2 | Freq: Once | INTRAVENOUS | Status: AC
  Administered 2015-10-20: 700 mg via INTRAVENOUS
  Filled 2015-10-20: qty 35

## 2015-10-20 NOTE — Progress Notes (Signed)
Brisk blood return noted prior, during, and post adriamycin IVP. Patient tolerated well.

## 2015-10-20 NOTE — Assessment & Plan Note (Signed)
This is likely due to recent treatment. The patient denies recent history of fevers, cough, chills, diarrhea or dysuria. He is asymptomatic from the leukopenia. I will observe for now.  I will continue the chemotherapy at current dose without dosage adjustment.  If the leukopenia gets progressive worse in the future, I might have to delay his treatment or adjust the chemotherapy dose. 

## 2015-10-20 NOTE — Assessment & Plan Note (Signed)
He has mild cough with congestion. He had recent antibiotic therapy. Chest x-ray is negative  I suspect it could be due to COPD but I will omit bleomycin indefinitely

## 2015-10-20 NOTE — Patient Instructions (Signed)
[facility] Discharge Instructions for Patients Receiving Chemotherapy  Today you received the following chemotherapy agents: Adriamycin, Vinblastine, Dacarbazine   To help prevent nausea and vomiting after your treatment, we encourage you to take your nausea medication as directed.    If you develop nausea and vomiting that is not controlled by your nausea medication, call the clinic.   BELOW ARE SYMPTOMS THAT SHOULD BE REPORTED IMMEDIATELY:  *FEVER GREATER THAN 100.5 F  *CHILLS WITH OR WITHOUT FEVER  NAUSEA AND VOMITING THAT IS NOT CONTROLLED WITH YOUR NAUSEA MEDICATION  *UNUSUAL SHORTNESS OF BREATH  *UNUSUAL BRUISING OR BLEEDING  TENDERNESS IN MOUTH AND THROAT WITH OR WITHOUT PRESENCE OF ULCERS  *URINARY PROBLEMS  *BOWEL PROBLEMS  UNUSUAL RASH Items with * indicate a potential emergency and should be followed up as soon as possible.  Feel free to call the clinic you have any questions or concerns. The clinic phone number is [phone].  Please show the CHEMO ALERT CARD at check-in to the Emergency Department and triage nurse.   

## 2015-10-20 NOTE — Assessment & Plan Note (Signed)
The patient has significant anxiety with difficulties coping due to recent disability from the cancer diagnosis. I spent a lot of time encouraging the patient. Per patient request, I will prescribe lorazepam as needed for anxiety. I recommend judicious use of lorazepam and warned him about risk of sedation

## 2015-10-20 NOTE — Assessment & Plan Note (Signed)
The patient had recent diagnosis of blood clot. He is compliant taking his anticoagulation therapy without any complications. The patient has some financial issues and not able to pay for Xarelto long-term. I gave him multiple resources to help him pay for his medications

## 2015-10-20 NOTE — Assessment & Plan Note (Signed)
PET CT scan showed remarkable near-complete response to treatment. We discussed the current guidelines. The plan would be to complete total 6 cycles of chemotherapy. Since bleomycin was omitted since cycle 2, he will be receiving Adriamycin, vinblastine and dacarbazine only. I will see him back in 4 weeks for further toxicity review

## 2015-10-20 NOTE — Assessment & Plan Note (Signed)
I spent some time counseling the patient the importance of tobacco cessation. he is currently attempting to quit on his own 

## 2015-10-20 NOTE — Progress Notes (Signed)
Blood return noted after Vinblastine infusion prior to starting Dacarbazine.

## 2015-10-20 NOTE — Progress Notes (Signed)
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      07/28/2015 Pathology Results    Accession: [id number] core needle biopsy of the right supraclavicular region came back positive for classical Hodgkin lymphoma      08/22/2015 Bone Marrow Biopsy    Accession: [id number] Bone marrow is negative for involvement      08/22/2015 Procedure    He has port placement and bone marrow biopsy      08/23/2015 Imaging    ECHO showed normal EF      08/24/2015 PET scan    Bilateral hypermetabolic supraclavicular lymphadenopathy and mediastinal adenopathy consistent with known Hodgkin's lymphoma. 2. No disease is identified below the diaphragms.      08/25/2015 -  Chemotherapy    He is started on ABVD      09/19/2015 Imaging    Superficial thrombosis is noted in the left greater saphenous vein, from distal thigh extending up to groin. Non-occlusive DVT is extending into the left common femoral vein, at the   saphenofemoral junction.      09/22/2015 Adverse Reaction    Due to cough, Bleomycin is placed on hold      10/14/2015 PET scan    Marked reduction in size of the the thoracic and supraclavicular adenopathy, with interval resolution of the prior hypermetabolic activity indicating response to effective therapy. No significant residual hypermetabolic adenopathy.       INTERVAL HISTORY: Please see below for problem oriented  charting. He is seen prior to cycle 3 of treatment. The patient has a lot of coping difficulties recently with anger issues. Family members also noted anxiety. He had mild cough but not severe. Denies recent fever or chills. The patient denies any recent signs or symptoms of bleeding such as spontaneous epistaxis, hematuria or hematochezia. He complained of excessive fatigue from recent treatment but denies nausea or vomiting  REVIEW OF SYSTEMS:   Constitutional: Denies fevers, chills or abnormal weight loss Eyes: Denies blurriness of vision Ears, nose, mouth, throat, and face: Denies mucositis or sore throat Respiratory: Denies cough, dyspnea or wheezes Cardiovascular: Denies palpitation, chest discomfort or lower extremity swelling Gastrointestinal:  Denies nausea, heartburn or change in bowel habits Skin: Denies abnormal skin rashes Lymphatics: Denies new lymphadenopathy or easy bruising Neurological:Denies numbness, tingling or new weaknesses All other systems were reviewed with the patient and are negative.  I have reviewed the past medical history, past surgical history, social history and family history with the patient and they are unchanged from previous note.  ALLERGIES:  has No Known Allergies.  MEDICATIONS:  Current Outpatient Prescriptions  Medication Sig Dispense Refill  . dexamethasone (DECADRON) 4 MG tablet Take 4-8 mg by mouth See admin instructions. Pt is to take two tablets once daily on the day after chemo and then two tablets two times daily for two days.    Marland Kitchen ibuprofen (ADVIL,MOTRIN) 200 MG tablet Take 400-600 mg by mouth every 6 (six) hours as needed for headache.    Marland Kitchen  lidocaine-prilocaine (EMLA) cream Apply 1 application topically as needed (prior to accessing port).    . Rivaroxaban 15 & 20 MG TBPK Take as directed on package: Start with one 19m tablet by mouth twice a day with food. On Day 22, switch to one 261mtablet once a day with food. 51 each 0  .  cephALEXin (KEFLEX) 500 MG capsule Take 1 capsule (500 mg total) by mouth 3 (three) times daily. (Patient not taking: Reported on 10/20/2015) 21 capsule 0  . LORazepam (ATIVAN) 0.5 MG tablet Take 1 tablet (0.5 mg total) by mouth 2 (two) times daily as needed for anxiety. 30 tablet 0  . ondansetron (ZOFRAN) 8 MG tablet Take 8 mg by mouth 2 (two) times daily as needed for nausea or vomiting.    . prochlorperazine (COMPAZINE) 10 MG tablet Take 10 mg by mouth every 6 (six) hours as needed for nausea or vomiting.    . rivaroxaban (XARELTO) 20 MG TABS tablet Take 1 tablet (20 mg total) by mouth daily with supper. (Patient not taking: Reported on 10/20/2015) 30 tablet 9   No current facility-administered medications for this visit.    Facility-Administered Medications Ordered in Other Visits  Medication Dose Route Frequency Provider Last Rate Last Dose  . sodium chloride flush (NS) 0.9 % injection 10 mL  10 mL Intracatheter PRN NiHeath LarkMD   10 mL at 10/20/15 1151    PHYSICAL EXAMINATION: ECOG PERFORMANCE STATUS: 1 - Symptomatic but completely ambulatory  Vitals:   10/20/15 0848  BP: 126/79  Pulse: 74  Resp: 18  Temp: 97.8 F (36.6 C)   Filed Weights   10/20/15 0848  Weight: 151 lb 9.6 oz (68.8 kg)    GENERAL:alert, no distress and comfortable SKIN: skin color, texture, turgor are normal, no rashes or significant lesions EYES: normal, Conjunctiva are pink and non-injected, sclera clear Musculoskeletal:no cyanosis of digits and no clubbing  NEURO: alert & oriented x 3 with fluent speech, no focal motor/sensory deficits  LABORATORY DATA:  I have reviewed the data as listed    Component Value Date/Time   NA 141 10/20/2015 0829   K 3.9 10/20/2015 0829   CL 102 09/29/2015 0218   CO2 25 10/20/2015 0829   GLUCOSE 100 10/20/2015 0829   BUN 6.5 (L) 10/20/2015 0829   CREATININE 0.8 10/20/2015 0829   CALCIUM 9.3 10/20/2015 0829   PROT 6.7 10/20/2015 0829   ALBUMIN 3.7 10/20/2015 0829    AST 19 10/20/2015 0829   ALT 25 10/20/2015 0829   ALKPHOS 69 10/20/2015 0829   BILITOT 0.46 10/20/2015 0829   GFRNONAA >60 09/29/2015 0218   GFRAA >60 09/29/2015 0218    No results found for: SPEP, UPEP  Lab Results  Component Value Date   WBC 3.5 (L) 10/20/2015   NEUTROABS 1.2 (L) 10/20/2015   HGB 13.7 10/20/2015   HCT 39.4 10/20/2015   MCV 89.3 10/20/2015   PLT 190 10/20/2015      Chemistry      Component Value Date/Time   NA 141 10/20/2015 0829   K 3.9 10/20/2015 0829   CL 102 09/29/2015 0218   CO2 25 10/20/2015 0829   BUN 6.5 (L) 10/20/2015 0829   CREATININE 0.8 10/20/2015 0829      Component Value Date/Time   CALCIUM 9.3 10/20/2015 0829   ALKPHOS 69 10/20/2015 0829   AST 19 10/20/2015 0829   ALT 25 10/20/2015 0829   BILITOT 0.46 10/20/2015 0829  RADIOGRAPHIC STUDIES:I reviewed the recent PET CT scan with him and his mother I have personally reviewed the radiological images as listed and agreed with the findings in the report.    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 We discussed the current guidelines. The plan would be to complete total 6 cycles of chemotherapy. Since bleomycin was omitted since cycle 2, he will be receiving Adriamycin, vinblastine and dacarbazine only. I will see him back in 4 weeks for further toxicity review   Drug-induced neutropenia ([place]) This is likely due to recent treatment. The patient denies recent history of fevers, cough, chills, diarrhea or dysuria. He is asymptomatic from the leukopenia. I will observe for now.  I will continue the chemotherapy at current dose without dosage adjustment.  If the leukopenia gets progressive worse in the future, I might have to delay his treatment or adjust the chemotherapy dose.    Cough, persistent He has mild cough with congestion. He had recent  antibiotic therapy. Chest x-ray is negative  I suspect it could be due to COPD but I will omit bleomycin indefinitely  Acute deep vein thrombosis (DVT) of left femoral vein ([facility]) The patient had recent diagnosis of blood clot. He is compliant taking his anticoagulation therapy without any complications. The patient has some financial issues and not able to pay for Xarelto long-term. I gave him multiple resources to help him pay for his medications  Tobacco abuse I spent some time counseling the patient the importance of tobacco cessation. he is currently attempting to quit on his own   Anxiety states The patient has significant anxiety with difficulties coping due to recent disability from the cancer diagnosis. I spent a lot of time encouraging the patient. Per patient request, I will prescribe lorazepam as needed for anxiety. I recommend judicious use of lorazepam and warned him about risk of sedation   No orders of the defined types were placed in this encounter.  All questions were answered. The patient knows to call the clinic with any problems, questions or concerns. No barriers to learning was detected. I spent 30 minutes counseling the patient face to face. The total time spent in the appointment was 40 minutes and more than 50% was on counseling and review of test results     [facility], [place], MD 10/20/2015 12:42 PM

## 2015-10-20 NOTE — Patient Instructions (Signed)

## 2015-10-23 ENCOUNTER — Telehealth: Payer: Self-pay | Admitting: Hematology and Oncology

## 2015-10-23 NOTE — Telephone Encounter (Signed)
S/w pt, gave appts for 9/21+ 10/5.

## 2015-11-03 ENCOUNTER — Ambulatory Visit: Payer: No Typology Code available for payment source

## 2015-11-03 ENCOUNTER — Ambulatory Visit (HOSPITAL_BASED_OUTPATIENT_CLINIC_OR_DEPARTMENT_OTHER): Payer: No Typology Code available for payment source

## 2015-11-03 ENCOUNTER — Other Ambulatory Visit (HOSPITAL_BASED_OUTPATIENT_CLINIC_OR_DEPARTMENT_OTHER): Payer: No Typology Code available for payment source

## 2015-11-03 VITALS — BP 114/70 | HR 73 | Temp 98.5°F | Resp 16

## 2015-11-03 DIAGNOSIS — Z5111 Encounter for antineoplastic chemotherapy: Secondary | ICD-10-CM | POA: Diagnosis not present

## 2015-11-03 DIAGNOSIS — C8118 Nodular sclerosis classical Hodgkin lymphoma, lymph nodes of multiple sites: Secondary | ICD-10-CM | POA: Diagnosis not present

## 2015-11-03 LAB — COMPREHENSIVE METABOLIC PANEL
ALT: 25 U/L (ref 0–55)
ANION GAP: 9 meq/L (ref 3–11)
AST: 22 U/L (ref 5–34)
Albumin: 3.7 g/dL (ref 3.5–5.0)
Alkaline Phosphatase: 64 U/L (ref 40–150)
BUN: 7.5 mg/dL (ref 7.0–26.0)
CHLORIDE: 105 meq/L (ref 98–109)
CO2: 25 meq/L (ref 22–29)
CREATININE: 0.8 mg/dL (ref 0.7–1.3)
Calcium: 9.2 mg/dL (ref 8.4–10.4)
EGFR: >90 mL/min/{1.73_m2} (ref >90)
Glucose: 84 mg/dl (ref 70–140)
POTASSIUM: 4.1 meq/L (ref 3.5–5.1)
Sodium: 139 mEq/L (ref 136–145)
Total Bilirubin: 0.44 mg/dL (ref 0.20–1.20)
Total Protein: 6.7 g/dL (ref 6.4–8.3)

## 2015-11-03 LAB — CBC WITH DIFFERENTIAL/PLATELET
BASO%: 2.2 % — AB (ref 0.0–2.0)
BASOS ABS: 0.1 10*3/uL (ref 0.0–0.1)
EOS%: 4 % (ref 0.0–7.0)
Eosinophils Absolute: 0.1 10*3/uL (ref 0.0–0.5)
HCT: 38.6 % (ref 38.4–49.9)
HGB: 13.1 g/dL (ref 13.0–17.1)
LYMPH%: 29.8 % (ref 14.0–49.0)
MCH: 30.3 pg (ref 27.2–33.4)
MCHC: 33.9 g/dL (ref 32.0–36.0)
MCV: 89.4 fL (ref 79.3–98.0)
MONO#: 0.7 10*3/uL (ref 0.1–0.9)
MONO%: 25.1 % — ABNORMAL HIGH (ref 0.0–14.0)
NEUT#: 1.1 10*3/uL — ABNORMAL LOW (ref 1.5–6.5)
NEUT%: 38.9 % — AB (ref 39.0–75.0)
Platelets: 201 10*3/uL (ref 140–400)
RBC: 4.32 10*6/uL (ref 4.20–5.82)
RDW: 14.7 % — ABNORMAL HIGH (ref 11.0–14.6)
WBC: 2.8 10*3/uL — ABNORMAL LOW (ref 4.0–10.3)
lymph#: 0.8 10*3/uL — ABNORMAL LOW (ref 0.9–3.3)

## 2015-11-03 MED ORDER — SODIUM CHLORIDE 0.9 % IV SOLN
Freq: Once | INTRAVENOUS | Status: AC
  Administered 2015-11-03: 11:00:00 via INTRAVENOUS

## 2015-11-03 MED ORDER — DOXORUBICIN HCL CHEMO IV INJECTION 2 MG/ML
25.0000 mg/m2 | Freq: Once | INTRAVENOUS | Status: AC
  Administered 2015-11-03: 46 mg via INTRAVENOUS
  Filled 2015-11-03: qty 23

## 2015-11-03 MED ORDER — SODIUM CHLORIDE 0.9 % IV SOLN
Freq: Once | INTRAVENOUS | Status: AC
  Administered 2015-11-03: 11:00:00 via INTRAVENOUS
  Filled 2015-11-03: qty 5

## 2015-11-03 MED ORDER — VINBLASTINE SULFATE CHEMO INJECTION 1 MG/ML
5.9500 mg/m2 | Freq: Once | INTRAVENOUS | Status: AC
  Administered 2015-11-03: 11 mg via INTRAVENOUS
  Filled 2015-11-03: qty 10

## 2015-11-03 MED ORDER — PALONOSETRON HCL INJECTION 0.25 MG/5ML
0.2500 mg | Freq: Once | INTRAVENOUS | Status: AC
  Administered 2015-11-03: 0.25 mg via INTRAVENOUS

## 2015-11-03 MED ORDER — SODIUM CHLORIDE 0.9 % IV SOLN
375.0000 mg/m2 | Freq: Once | INTRAVENOUS | Status: AC
  Administered 2015-11-03: 700 mg via INTRAVENOUS
  Filled 2015-11-03: qty 35

## 2015-11-03 MED ORDER — PALONOSETRON HCL INJECTION 0.25 MG/5ML
INTRAVENOUS | Status: AC
  Filled 2015-11-03: qty 5

## 2015-11-03 MED ORDER — SODIUM CHLORIDE 0.9% FLUSH
10.0000 mL | INTRAVENOUS | Status: DC | PRN
  Administered 2015-11-03: 10 mL
  Filled 2015-11-03: qty 10

## 2015-11-03 MED ORDER — HEPARIN SOD (PORK) LOCK FLUSH 100 UNIT/ML IV SOLN
500.0000 [IU] | Freq: Once | INTRAVENOUS | Status: AC | PRN
  Administered 2015-11-03: 500 [IU]
  Filled 2015-11-03: qty 5

## 2015-11-17 ENCOUNTER — Ambulatory Visit: Payer: No Typology Code available for payment source

## 2015-11-17 ENCOUNTER — Ambulatory Visit (HOSPITAL_BASED_OUTPATIENT_CLINIC_OR_DEPARTMENT_OTHER): Payer: No Typology Code available for payment source | Admitting: Hematology and Oncology

## 2015-11-17 ENCOUNTER — Telehealth: Payer: Self-pay | Admitting: Hematology and Oncology

## 2015-11-17 ENCOUNTER — Other Ambulatory Visit (HOSPITAL_BASED_OUTPATIENT_CLINIC_OR_DEPARTMENT_OTHER): Payer: No Typology Code available for payment source

## 2015-11-17 ENCOUNTER — Encounter: Payer: Self-pay | Admitting: Hematology and Oncology

## 2015-11-17 VITALS — BP 124/74 | HR 66 | Temp 98.1°F | Resp 18 | Ht 72.0 in | Wt 152.1 lb

## 2015-11-17 DIAGNOSIS — Z95828 Presence of other vascular implants and grafts: Secondary | ICD-10-CM

## 2015-11-17 DIAGNOSIS — C8118 Nodular sclerosis classical Hodgkin lymphoma, lymph nodes of multiple sites: Secondary | ICD-10-CM

## 2015-11-17 DIAGNOSIS — I82412 Acute embolism and thrombosis of left femoral vein: Secondary | ICD-10-CM | POA: Diagnosis not present

## 2015-11-17 DIAGNOSIS — B029 Zoster without complications: Secondary | ICD-10-CM

## 2015-11-17 DIAGNOSIS — C8115 Nodular sclerosis classical Hodgkin lymphoma, lymph nodes of inguinal region and lower limb: Secondary | ICD-10-CM

## 2015-11-17 DIAGNOSIS — J069 Acute upper respiratory infection, unspecified: Secondary | ICD-10-CM

## 2015-11-17 DIAGNOSIS — Z720 Tobacco use: Secondary | ICD-10-CM

## 2015-11-17 DIAGNOSIS — D702 Other drug-induced agranulocytosis: Secondary | ICD-10-CM

## 2015-11-17 LAB — COMPREHENSIVE METABOLIC PANEL
ALBUMIN: 3.4 g/dL — AB (ref 3.5–5.0)
ALT: 16 U/L (ref 0–55)
ANION GAP: 10 meq/L (ref 3–11)
AST: 15 U/L (ref 5–34)
Alkaline Phosphatase: 66 U/L (ref 40–150)
BILIRUBIN TOTAL: 0.31 mg/dL (ref 0.20–1.20)
BUN: 5.1 mg/dL — AB (ref 7.0–26.0)
CO2: 25 meq/L (ref 22–29)
CREATININE: 0.8 mg/dL (ref 0.7–1.3)
Calcium: 8.7 mg/dL (ref 8.4–10.4)
Chloride: 108 mEq/L (ref 98–109)
GLUCOSE: 113 mg/dL (ref 70–140)
POTASSIUM: 3.4 meq/L — AB (ref 3.5–5.1)
SODIUM: 142 meq/L (ref 136–145)
TOTAL PROTEIN: 6.2 g/dL — AB (ref 6.4–8.3)

## 2015-11-17 LAB — CBC WITH DIFFERENTIAL/PLATELET
BASO%: 2.1 % — ABNORMAL HIGH (ref 0.0–2.0)
BASOS ABS: 0.1 10*3/uL (ref 0.0–0.1)
EOS ABS: 0.2 10*3/uL (ref 0.0–0.5)
EOS%: 6.6 % (ref 0.0–7.0)
HCT: 36.6 % — ABNORMAL LOW (ref 38.4–49.9)
HEMOGLOBIN: 12.7 g/dL — AB (ref 13.0–17.1)
LYMPH%: 30.6 % (ref 14.0–49.0)
MCH: 31.4 pg (ref 27.2–33.4)
MCHC: 34.7 g/dL (ref 32.0–36.0)
MCV: 90.4 fL (ref 79.3–98.0)
MONO#: 0.7 10*3/uL (ref 0.1–0.9)
MONO%: 28.5 % — AB (ref 0.0–14.0)
NEUT%: 32.2 % — ABNORMAL LOW (ref 39.0–75.0)
NEUTROS ABS: 0.8 10*3/uL — AB (ref 1.5–6.5)
PLATELETS: 184 10*3/uL (ref 140–400)
RBC: 4.05 10*6/uL — ABNORMAL LOW (ref 4.20–5.82)
RDW: 15.1 % — AB (ref 11.0–14.6)
WBC: 2.4 10*3/uL — AB (ref 4.0–10.3)
lymph#: 0.7 10*3/uL — ABNORMAL LOW (ref 0.9–3.3)

## 2015-11-17 MED ORDER — SODIUM CHLORIDE 0.9 % IJ SOLN
10.0000 mL | INTRAMUSCULAR | Status: DC | PRN
  Administered 2015-11-17: 10 mL via INTRAVENOUS
  Filled 2015-11-17: qty 10

## 2015-11-17 MED ORDER — AMOXICILLIN-POT CLAVULANATE 875-125 MG PO TABS
1.0000 | ORAL_TABLET | Freq: Two times a day (BID) | ORAL | 0 refills | Status: DC
Start: 2015-11-17 — End: 2015-12-01

## 2015-11-17 MED ORDER — VALACYCLOVIR HCL 1 G PO TABS: 1000.0000 mg | ORAL_TABLET | Freq: Three times a day (TID) | ORAL | 0 refills | Status: AC

## 2015-11-17 MED ORDER — HEPARIN SOD (PORK) LOCK FLUSH 100 UNIT/ML IV SOLN
500.0000 [IU] | Freq: Once | INTRAVENOUS | Status: AC | PRN
  Administered 2015-11-17: 500 [IU] via INTRAVENOUS
  Filled 2015-11-17: qty 5

## 2015-11-17 NOTE — Assessment & Plan Note (Addendum)
He has signs of URI. He is neutropenic. I will prescribe one week course of Augmentin and delay his treatment today

## 2015-11-17 NOTE — Assessment & Plan Note (Signed)
Unfortunately, the patient have signs of shingles. He also has a upper respiratory tract infection. I plan to delay his treatment for 2 weeks. I will see him back in 2 weeks before we resume treatment.

## 2015-11-17 NOTE — Assessment & Plan Note (Signed)
This is likely precipitated by stress. I will start him on high-dose Valtrex for 1 week and reassess. I also discussed reasons of covering up his rash

## 2015-11-17 NOTE — Telephone Encounter (Signed)
Avs report and appointment schedule given to patient, per 11/17/15 los. °

## 2015-11-17 NOTE — Progress Notes (Signed)
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      07/28/2015 Pathology Results    Accession: [id number] core needle biopsy of the right supraclavicular region came back positive for classical Hodgkin lymphoma      08/22/2015 Bone Marrow Biopsy    Accession: [id number] Bone marrow is negative for involvement      08/22/2015 Procedure    He has port placement and bone marrow biopsy      08/23/2015 Imaging    ECHO showed normal EF      08/24/2015 PET scan    Bilateral hypermetabolic supraclavicular lymphadenopathy and mediastinal adenopathy consistent with known Hodgkin's lymphoma. 2. No disease is identified below the diaphragms.      08/25/2015 -  Chemotherapy    He is started on ABVD      09/19/2015 Imaging    Superficial thrombosis is noted in the left greater saphenous vein, from distal thigh extending up to groin. Non-occlusive DVT is extending into the left common femoral vein, at the   saphenofemoral junction.      09/22/2015 Adverse Reaction    Due to cough, Bleomycin is placed on hold      10/14/2015 PET scan    Marked reduction in size of the the thoracic and supraclavicular adenopathy, with interval resolution of the prior hypermetabolic activity indicating response to effective therapy. No significant residual hypermetabolic adenopathy.       INTERVAL HISTORY: Please see below for problem oriented  charting. He is seen prior to cycle 4 of treatment. Over the past weekend, the patient started to have upper respiratory tract infection with nasal congestion, productive cough and sore throat. He also broke out in the rash 4 days ago on his back that is described as itchy He denies fever or chills The patient denies any recent signs or symptoms of bleeding such as spontaneous epistaxis, hematuria or hematochezia.   REVIEW OF SYSTEMS:   Constitutional: Denies fevers, chills or abnormal weight loss Eyes: Denies blurriness of vision Ears, nose, mouth, throat, and face: Denies mucositis or sore throat Cardiovascular: Denies palpitation, chest discomfort or lower extremity swelling Gastrointestinal:  Denies nausea, heartburn or change in bowel habits Lymphatics: Denies new lymphadenopathy or easy bruising Neurological:Denies numbness, tingling or new weaknesses Behavioral/Psych: Mood is stable, no new changes  All other systems were reviewed with the patient and are negative.  I have reviewed the past medical history, past surgical history, social history and family history with the patient and they are unchanged from previous note.  ALLERGIES:  has No Known Allergies.  MEDICATIONS:  Current Outpatient Prescriptions  Medication Sig Dispense Refill  . dexamethasone (DECADRON) 4 MG tablet Take 4-8 mg by mouth See admin instructions. Pt is to take two tablets once daily on the day after chemo and then two tablets two times daily for two days.    Marland Kitchen ibuprofen (ADVIL,MOTRIN) 200 MG tablet Take 400-600 mg by mouth every 6 (six) hours as needed for headache.    Marland Kitchen  lidocaine-prilocaine (EMLA) cream Apply 1 application topically as needed (prior to accessing port).    . LORazepam (ATIVAN) 0.5 MG tablet Take 1 tablet (0.5 mg total) by mouth 2 (two) times daily as needed for anxiety. 30 tablet 0  . ondansetron (ZOFRAN) 8 MG tablet Take 8 mg by mouth 2 (two) times daily as needed for nausea or vomiting.     . prochlorperazine (COMPAZINE) 10 MG tablet Take 10 mg by mouth every 6 (six) hours as needed for nausea or vomiting.    . rivaroxaban (XARELTO) 20 MG TABS tablet Take 1 tablet (20 mg total) by mouth daily with supper. 30 tablet 9  . amoxicillin-clavulanate (AUGMENTIN) 875-125 MG tablet Take 1 tablet by mouth 2 (two) times daily. 14 tablet 0  . valACYclovir (VALTREX) 1000 MG tablet Take 1 tablet (1,000 mg total) by mouth 3 (three) times daily. 21 tablet 0   No current facility-administered medications for this visit.     PHYSICAL EXAMINATION: ECOG PERFORMANCE STATUS: 1 - Symptomatic but completely ambulatory  Vitals:   11/17/15 0829  BP: 124/74  Pulse: 66  Resp: 18  Temp: 98.1 F (36.7 C)   Filed Weights   11/17/15 0829  Weight: 152 lb 1.6 oz (69 kg)    GENERAL:alert, no distress and comfortable SKIN: No rash on his back resemble shingles EYES: normal, Conjunctiva are pink and non-injected, sclera clear OROPHARYNX:no exudate, no erythema and lips, buccal mucosa, and tongue normal  NECK: supple, thyroid normal size, non-tender, without nodularity LYMPH:  Very mild reactive lymphadenopathy on his neck LUNGS: clear to auscultation and percussion with normal breathing effort HEART: regular rate & rhythm and no murmurs and no lower extremity edema ABDOMEN:abdomen soft, non-tender and normal bowel sounds Musculoskeletal:no cyanosis of digits and no clubbing  NEURO: alert & oriented x 3 with fluent speech, no focal motor/sensory deficits  LABORATORY DATA:  I have reviewed the data as listed    Component Value Date/Time   NA 142 11/17/2015 0804   K 3.4 (L) 11/17/2015 0804   CL 102 09/29/2015 0218   CO2 25 11/17/2015 0804   GLUCOSE 113 11/17/2015 0804   BUN 5.1 (L) 11/17/2015 0804   CREATININE 0.8 11/17/2015 0804   CALCIUM 8.7 11/17/2015 0804   PROT 6.2 (L) 11/17/2015 0804   ALBUMIN 3.4 (L) 11/17/2015 0804   AST 15 11/17/2015 0804   ALT 16 11/17/2015 0804   ALKPHOS 66  11/17/2015 0804   BILITOT 0.31 11/17/2015 0804   GFRNONAA >60 09/29/2015 0218   GFRAA >60 09/29/2015 0218    No results found for: SPEP, UPEP  Lab Results  Component Value Date   WBC 2.4 (L) 11/17/2015   NEUTROABS 0.8 (L) 11/17/2015   HGB 12.7 (L) 11/17/2015   HCT 36.6 (L) 11/17/2015   MCV 90.4 11/17/2015   PLT 184 11/17/2015      Chemistry      Component Value Date/Time   NA 142 11/17/2015 0804   K 3.4 (L) 11/17/2015 0804   CL 102 09/29/2015 0218   CO2 25 11/17/2015 0804   BUN 5.1 (L) 11/17/2015 0804   CREATININE 0.8 11/17/2015 0804      Component Value Date/Time   CALCIUM 8.7 11/17/2015 0804   ALKPHOS 66 11/17/2015 0804   AST 15 11/17/2015 0804   ALT 16 11/17/2015 0804   BILITOT 0.31 11/17/2015 0804      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 He also has a upper respiratory tract infection. I plan to delay his treatment for 2 weeks. I will see him back in 2 weeks before we resume treatment.  Shingles rash This is likely precipitated by stress. I will start him on high-dose Valtrex for 1 week and reassess. I also discussed reasons of covering up his rash  URI (upper respiratory infection) He has signs of URI. He is neutropenic. I will prescribe one week course of Augmentin and delay his treatment today  Acute deep vein thrombosis (DVT) of left femoral vein ([place]) The patient had recent diagnosis of blood clot. He is compliant taking his anticoagulation therapy without any complications.  Tobacco abuse I spent some time counseling the patient the importance of tobacco cessation. he is currently attempting to quit on his own    No orders of the defined types were placed in this encounter.  All questions were answered. The patient knows to call the clinic with any problems, questions or concerns. No barriers to learning was detected. I  spent 25 minutes counseling the patient face to face. The total time spent in the appointment was 30 minutes and more than 50% was on counseling and review of test results     [facility], [place], MD 11/17/2015 11:16 AM

## 2015-11-17 NOTE — Assessment & Plan Note (Signed)
I spent some time counseling the patient the importance of tobacco cessation. he is currently attempting to quit on his own 

## 2015-11-17 NOTE — Assessment & Plan Note (Signed)
The patient had recent diagnosis of blood clot. He is compliant taking his anticoagulation therapy without any complications.

## 2015-11-25 ENCOUNTER — Ambulatory Visit (HOSPITAL_BASED_OUTPATIENT_CLINIC_OR_DEPARTMENT_OTHER): Payer: No Typology Code available for payment source | Admitting: Nurse Practitioner

## 2015-11-25 ENCOUNTER — Encounter: Payer: Self-pay | Admitting: Nurse Practitioner

## 2015-11-25 ENCOUNTER — Telehealth: Payer: Self-pay | Admitting: *Deleted

## 2015-11-25 VITALS — BP 145/98 | HR 72 | Temp 98.3°F | Resp 18 | Ht 72.0 in | Wt 151.1 lb

## 2015-11-25 DIAGNOSIS — C8118 Nodular sclerosis classical Hodgkin lymphoma, lymph nodes of multiple sites: Secondary | ICD-10-CM | POA: Diagnosis not present

## 2015-11-25 DIAGNOSIS — L03116 Cellulitis of left lower limb: Secondary | ICD-10-CM | POA: Diagnosis not present

## 2015-11-25 DIAGNOSIS — B029 Zoster without complications: Secondary | ICD-10-CM

## 2015-11-25 DIAGNOSIS — I82812 Embolism and thrombosis of superficial veins of left lower extremities: Secondary | ICD-10-CM | POA: Insufficient documentation

## 2015-11-25 DIAGNOSIS — J069 Acute upper respiratory infection, unspecified: Secondary | ICD-10-CM

## 2015-11-25 NOTE — Telephone Encounter (Signed)
I am overbooked Please get Cyndee to see at Symptom management

## 2015-11-25 NOTE — Assessment & Plan Note (Signed)
Patient was seen last week and diagnosed with a mild upper respiratory infection.  He was given off mittens; and states that he is finished 8 of the 10 days.  He was prescribed medication.  He has no further URI symptoms whatsoever.

## 2015-11-25 NOTE — Telephone Encounter (Signed)
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  Last night he noticed pain to his left lower leg with a red knot the size of a silver dollar that's a little warm and swollen.  It's inside the left calf area.  Call my mobile number [phone].

## 2015-11-25 NOTE — Telephone Encounter (Signed)
Ross Hansen with provider recommendations to come in.  On his way in and has a thirty minute drive.  Scheduling message sent.

## 2015-11-25 NOTE — Assessment & Plan Note (Signed)
Patient was also diagnosed with probable shingles rash to the upper mid back last week as well.  He states that the rash was pruritic; never had any pain.  He continues to take by mouth valacyclovir as directed.  Exam today reveals probable shingles rash that appears to be drying and scabbing.  No evidence of infection.

## 2015-11-25 NOTE — Progress Notes (Signed)
SYMPTOM MANAGEMENT CLINIC    Chief Complaint: Cellulitis  HPI:  Ross Hansen 30 y.o. male diagnosed with Hodgkin's lymphoma.  Currently undergoing ABVD chemotherapy regimen.     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      07/28/2015 Pathology Results    Accession: [id number] core needle biopsy of the right supraclavicular region came back positive for classical Hodgkin lymphoma      08/22/2015 Bone Marrow Biopsy    Accession: [id number] Bone marrow is negative for involvement      08/22/2015 Procedure    He has port placement and bone marrow biopsy      08/23/2015 Imaging    ECHO showed normal EF      08/24/2015 PET scan    Bilateral hypermetabolic supraclavicular lymphadenopathy and mediastinal adenopathy consistent with known Hodgkin's lymphoma. 2. No disease is identified below the diaphragms.      08/25/2015 -  Chemotherapy    He is started on ABVD      09/19/2015 Imaging    Superficial thrombosis is noted in the left greater saphenous vein, from distal thigh extending up to groin. Non-occlusive DVT is extending into the left common femoral vein, at the   saphenofemoral junction.      09/22/2015 Adverse Reaction    Due to cough, Bleomycin is placed on hold      10/14/2015 PET scan    Marked reduction in size of the the thoracic and supraclavicular adenopathy, with interval resolution of the prior hypermetabolic activity indicating response to effective therapy. No significant residual hypermetabolic adenopathy.       Review of Systems  Skin:   Left medial calf cellulitis  All other systems reviewed and are negative.   Past Medical History:  Diagnosis Date  . Allergy   . Cancer [facility])    hodgkin lymphoma  . Familial multiple factor deficiency syndrome, type IV (HCC)    "Factor IV Leiden"    Past Surgical History:  Procedure Laterality Date  . TONSILLECTOMY    . 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    has No Known Allergies.    Medication List       Accurate as of 11/25/15  7:40 PM. Always use your most recent med list.          amoxicillin-clavulanate 875-125 MG tablet Commonly known as:  AUGMENTIN Take 1 tablet by mouth 2 (two) times daily.   dexamethasone 4 MG tablet Commonly known as:  DECADRON Take 4-8 mg by mouth See admin instructions. Pt is to take two tablets once daily on the day after chemo and then two tablets two times daily for two days.   ibuprofen 200 MG tablet Commonly known as:  ADVIL,MOTRIN Take 400-600 mg by mouth every 6 (six) hours as needed for headache.   lidocaine-prilocaine  cream Commonly known as:  EMLA Apply 1 application topically as needed (prior to accessing port).   LORazepam 0.5 MG tablet Commonly known as:  ATIVAN Take 1 tablet (0.5 mg total) by mouth 2 (two) times daily as needed for anxiety.   ondansetron 8 MG tablet Commonly known as:  ZOFRAN Take 8 mg by mouth 2 (two) times daily as needed for nausea or vomiting.   prochlorperazine 10 MG tablet Commonly known as:  COMPAZINE Take 10 mg by mouth every 6 (six) hours as needed for nausea or vomiting.   rivaroxaban 20 MG Tabs tablet Commonly known as:   XARELTO Take 1 tablet (20 mg total) by mouth daily with supper.        PHYSICAL EXAMINATION  Oncology Vitals 11/25/2015 11/17/2015  Height 183 cm 183 cm  Weight 68.539 kg 68.992 kg  Weight (lbs) 151 lbs 2 oz 152 lbs 2 oz  BMI (kg/m2) 20.49 kg/m2 20.63 kg/m2  Temp 98.3 98.1  Pulse 72 66  Resp 18 18  SpO2 100 100  BSA (m2) 1.87 m2 1.87 m2   BP Readings from Last 2 Encounters:  11/25/15 (!) 145/98  11/17/15 124/74    Physical Exam  Constitutional: He is oriented to person, place, and time and well-developed, well-nourished, and in no distress.  HENT:  Head: Normocephalic and atraumatic.  Eyes: Conjunctivae and EOM are normal. Pupils are equal, round, and reactive to light.  Neck: Normal range of motion.  Pulmonary/Chest: Effort normal. No respiratory distress.  Musculoskeletal: Normal range of motion. He exhibits edema. He exhibits no tenderness.  Neurological: He is alert and oriented to person, place, and time. Gait normal.  Skin: Skin is warm and dry. Rash noted. There is erythema. No pallor.  Exam of the left medial calf reveals an approximately 2-3 cm in diameter area of erythema.  There is also some mild edema to the site as well; but no tenderness and no red streaks.  There is also some trace warmth to the site as well.  There is no obvious insect bite at the site.  The colon, but it does appear that this could questionably be related to some mildly ingrown hairs.  There is no abscess to the site.  Patient also has what appears to be a resolving shingles rash to his upper middle back.   Psychiatric: Affect normal.  Nursing note and vitals reviewed.   LABORATORY DATA:. No visits with results within 3 Day(s) from this visit.  Latest known visit with results is:  Appointment on 11/17/2015  Component Date Value Ref Range Status  . WBC 11/17/2015 2.4* 4.0 - 10.3 10e3/uL Final  . NEUT# 11/17/2015 0.8* 1.5 - 6.5 10e3/uL Final  . HGB 11/17/2015 12.7* 13.0 - 17.1 g/dL Final   . HCT 11/17/2015 36.6* 38.4 - 49.9 % Final  . Platelets 11/17/2015 184  140 - 400 10e3/uL Final  . MCV 11/17/2015 90.4  79.3 - 98.0 fL Final  . MCH 11/17/2015 31.4  27.2 - 33.4 pg Final  . MCHC 11/17/2015 34.7  32.0 - 36.0 g/dL Final  . RBC 11/17/2015 4.05* 4.20 - 5.82 10e6/uL Final  . RDW 11/17/2015 15.1* 11.0 - 14.6 % Final  . lymph# 11/17/2015 0.7* 0.9 - 3.3 10e3/uL Final  . MONO# 11/17/2015 0.7  0.1 - 0.9 10e3/uL Final  . Eosinophils Absolute 11/17/2015 0.2  0.0 - 0.5 10e3/uL Final  . Basophils Absolute 11/17/2015 0.1  0.0 - 0.1 10e3/uL Final  . NEUT% 11/17/2015 32.2* 39.0 -  75.0 % Final  . LYMPH% 11/17/2015 30.6  14.0 - 49.0 % Final  . MONO% 11/17/2015 28.5* 0.0 - 14.0 % Final  . EOS% 11/17/2015 6.6  0.0 - 7.0 % Final  . BASO% 11/17/2015 2.1* 0.0 - 2.0 % Final  . Sodium 11/17/2015 142  136 - 145 mEq/L Final  . Potassium 11/17/2015 3.4* 3.5 - 5.1 mEq/L Final  . Chloride 11/17/2015 108  98 - 109 mEq/L Final  . CO2 11/17/2015 25  22 - 29 mEq/L Final  . Glucose 11/17/2015 113  70 - 140 mg/dl Final  . BUN 11/17/2015 5.1* 7.0 - 26.0 mg/dL Final  . Creatinine 11/17/2015 0.8  0.7 - 1.3 mg/dL Final  . Total Bilirubin 11/17/2015 0.31  0.20 - 1.20 mg/dL Final  . Alkaline Phosphatase 11/17/2015 66  40 - 150 U/L Final  . AST 11/17/2015 15  5 - 34 U/L Final  . ALT 11/17/2015 16  0 - 55 U/L Final  . Total Protein 11/17/2015 6.2* 6.4 - 8.3 g/dL Final  . Albumin 11/17/2015 3.4* 3.5 - 5.0 g/dL Final  . Calcium 11/17/2015 8.7  8.4 - 10.4 mg/dL Final  . Anion Gap 11/17/2015 10  3 - 11 mEq/L Final  . EGFR 11/17/2015 >90  >90 ml/min/1.73 m2 Final   Left medial calf:   3 RADIOGRAPHIC STUDIES: No results found.  ASSESSMENT/PLAN:    URI (upper respiratory infection) Patient was seen last week and diagnosed with a mild upper respiratory infection.  He was given off mittens; and states that he is finished 8 of the 10 days.  He was prescribed medication.  He has no further URI symptoms  whatsoever.  Shingles rash  Patient was also diagnosed with probable shingles rash to the upper mid back last week as well.  He states that the rash was pruritic; never had any pain.  He continues to take by mouth valacyclovir as directed.  Exam today reveals probable shingles rash that appears to be drying and scabbing.  No evidence of infection.  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  He is scheduled to return on 12/01/2015 for labs, flush, visit, and his next cycle of chemotherapy.  Cellulitis Patient has a history of chronic cellulitis; and states that he is oriented.  Been given a printed prescription for Keflex to take it if he begins having symptoms of cellulitis again.  He states that he developed some erythema to the left medial calf within the last day or so.  He denies any specific tenderness to the site or itching.  He states that he does not remember having any insect bites to that region.  However, patient does admit he was working in the woods recently.  Patient also notes that he has had cellulitis in the past.  Most likely secondary to an ingrown hair.  Patient denies any recent fevers or chills.  Exam of the left medial calf reveals an approximately 2-3 cm in diameter area of erythema.  There is also some mild edema to the site as well; but no tenderness and no red streaks.  There is also some trace warmth to the site as well.  There is no obvious insect bite at the site.  The colon, but it does appear that this could questionably be related to some mildly ingrown hairs.  There is no abscess to the site.  Since patient already  has Keflex antibiotic prescription at home-patient was advised to get the Keflex filled and to take as directed.  May also apply some moist compresses to the site as well.  He was encouraged to directed to the emergency department  over the weekend if any new worries or concerns whatsoever.   Patient stated understanding of all instructions; and was in agreement with this plan of care. The patient knows to call the clinic with any problems, questions or concerns.   Total time spent with patient was 25 minutes;  with greater than 75 percent of that time spent in face to face counseling regarding patient's symptoms,  and coordination of care and follow up.  Disclaimer:This dictation was prepared with Dragon/digital dictation along with [employer]. Any transcriptional errors that result from this process are unintentional.  Drue Second, NP 11/25/2015

## 2015-11-25 NOTE — Assessment & Plan Note (Signed)
Patient has a history of chronic cellulitis; and states that he is oriented.  Been given a printed prescription for Keflex to take it if he begins having symptoms of cellulitis again.  He states that he developed some erythema to the left medial calf within the last day or so.  He denies any specific tenderness to the site or itching.  He states that he does not remember having any insect bites to that region.  However, patient does admit he was working in the woods recently.  Patient also notes that he has had cellulitis in the past.  Most likely secondary to an ingrown hair.  Patient denies any recent fevers or chills.  Exam of the left medial calf reveals an approximately 2-3 cm in diameter area of erythema.  There is also some mild edema to the site as well; but no tenderness and no red streaks.  There is also some trace warmth to the site as well.  There is no obvious insect bite at the site.  The colon, but it does appear that this could questionably be related to some mildly ingrown hairs.  There is no abscess to the site.  Since patient already has Keflex antibiotic prescription at home-patient was advised to get the Keflex filled and to take as directed.  May also apply some moist compresses to the site as well.  He was encouraged to directed to the emergency department over the weekend if any new worries or concerns whatsoever.

## 2015-11-25 NOTE — Assessment & Plan Note (Signed)
Patient received cycle 3, day 15 of his ABVD chemotherapy regimen were seventh 2017.  He is scheduled to return on 12/01/2015 for labs, flush, visit, and his next cycle of chemotherapy.

## 2015-11-29 ENCOUNTER — Other Ambulatory Visit: Payer: Self-pay | Admitting: *Deleted

## 2015-11-29 ENCOUNTER — Ambulatory Visit (HOSPITAL_BASED_OUTPATIENT_CLINIC_OR_DEPARTMENT_OTHER): Payer: No Typology Code available for payment source | Admitting: Nurse Practitioner

## 2015-11-29 ENCOUNTER — Ambulatory Visit (HOSPITAL_BASED_OUTPATIENT_CLINIC_OR_DEPARTMENT_OTHER): Payer: No Typology Code available for payment source

## 2015-11-29 ENCOUNTER — Ambulatory Visit (HOSPITAL_COMMUNITY)
Admission: RE | Admit: 2015-11-29 | Discharge: 2015-11-29 | Disposition: A | Discharge status: Home / Self Care | Payer: No Typology Code available for payment source | Source: Ambulatory Visit | Attending: Nurse Practitioner | Admitting: Nurse Practitioner

## 2015-11-29 ENCOUNTER — Telehealth: Payer: Self-pay | Admitting: Hematology and Oncology

## 2015-11-29 ENCOUNTER — Telehealth: Payer: Self-pay | Admitting: *Deleted

## 2015-11-29 ENCOUNTER — Telehealth: Payer: Self-pay | Admitting: Nurse Practitioner

## 2015-11-29 ENCOUNTER — Encounter: Payer: Self-pay | Admitting: Nurse Practitioner

## 2015-11-29 VITALS — BP 128/77 | HR 73 | Temp 98.5°F | Resp 16 | Wt 148.5 lb

## 2015-11-29 DIAGNOSIS — L03119 Cellulitis of unspecified part of limb: Secondary | ICD-10-CM

## 2015-11-29 DIAGNOSIS — C8118 Nodular sclerosis classical Hodgkin lymphoma, lymph nodes of multiple sites: Secondary | ICD-10-CM | POA: Insufficient documentation

## 2015-11-29 DIAGNOSIS — I82812 Embolism and thrombosis of superficial veins of left lower extremities: Secondary | ICD-10-CM

## 2015-11-29 LAB — CBC WITH DIFFERENTIAL/PLATELET
BASO%: 1.3 % (ref 0.0–2.0)
Basophils Absolute: 0.1 10*3/uL (ref 0.0–0.1)
EOS%: 5.2 % (ref 0.0–7.0)
Eosinophils Absolute: 0.4 10*3/uL (ref 0.0–0.5)
HEMATOCRIT: 42.3 % (ref 38.4–49.9)
HEMOGLOBIN: 14.2 g/dL (ref 13.0–17.1)
LYMPH#: 1 10*3/uL (ref 0.9–3.3)
LYMPH%: 15.4 % (ref 14.0–49.0)
MCH: 31.1 pg (ref 27.2–33.4)
MCHC: 33.5 g/dL (ref 32.0–36.0)
MCV: 92.8 fL (ref 79.3–98.0)
MONO#: 1.1 10*3/uL — ABNORMAL HIGH (ref 0.1–0.9)
MONO%: 17 % — ABNORMAL HIGH (ref 0.0–14.0)
NEUT%: 61.1 % (ref 39.0–75.0)
NEUTROS ABS: 4.1 10*3/uL (ref 1.5–6.5)
Platelets: 246 10*3/uL (ref 140–400)
RBC: 4.56 10*6/uL (ref 4.20–5.82)
RDW: 16.5 % — AB (ref 11.0–14.6)
WBC: 6.8 10*3/uL (ref 4.0–10.3)

## 2015-11-29 LAB — COMPREHENSIVE METABOLIC PANEL
ALBUMIN: 3.7 g/dL (ref 3.5–5.0)
ALK PHOS: 81 U/L (ref 40–150)
ALT: 20 U/L (ref 0–55)
AST: 18 U/L (ref 5–34)
Anion Gap: 8 mEq/L (ref 3–11)
BUN: 4.8 mg/dL — AB (ref 7.0–26.0)
CALCIUM: 9.2 mg/dL (ref 8.4–10.4)
CO2: 26 mEq/L (ref 22–29)
CREATININE: 0.9 mg/dL (ref 0.7–1.3)
Chloride: 106 mEq/L (ref 98–109)
EGFR: >90 mL/min/{1.73_m2} (ref >90)
GLUCOSE: 83 mg/dL (ref 70–140)
Potassium: 4.2 mEq/L (ref 3.5–5.1)
Sodium: 140 mEq/L (ref 136–145)
Total Bilirubin: 0.48 mg/dL (ref 0.20–1.20)
Total Protein: 6.8 g/dL (ref 6.4–8.3)

## 2015-11-29 NOTE — Assessment & Plan Note (Signed)
Patient received cycle 3, day 15 of his ABVD chemotherapy regimen were seventh 2017.  He is scheduled to return on 12/01/2015 for labs, flush, visit, and his next cycle of chemotherapy.

## 2015-11-29 NOTE — Telephone Encounter (Signed)
Received call from pt stating that he saw Selena Lesser NP last Friday for cellulitis & she didn't think it was a blood clot but maybe cellulitis.  He is on an ATB. He reports that area is no better & has a knot now & he would like to see Dr Alvy Bimler.  Discussed with Dr Calton Dach nurse & pt informed to come on in for lab & see Selena Lesser NP.

## 2015-11-29 NOTE — Assessment & Plan Note (Signed)
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  He continues to take Eliquis is previously directed; but admits to missing some intermittent doses in the past.  He had noted a left medial lump to his lower leg a week or so ago; and initially thought that it was cellulitis once again.  Patient has history of chronic cellulitis as well; and our he had Keflex at home to take if he develops any new cellulitis.  Patient states that he began taking the Keflex; but did not see any improvement in the site.  He denies any other new lesions whatsoever.  He denies any chest pain, chest pressure, shortness breath with inspiration.  Exam today reveals that the left medial lower leg.  Site has become slightly firmer and enlarged.  Is also a little more red as well.  There are no red streaks, or warmth to the site.  Doppler ultrasound obtained today revealed new onset left lower extremity superficial thrombosis.  Patient was advised that he could discontinue the use of the Keflex antibiotics.  He should elevate his legs whenever possible; and he could use compresses to the site as well.  He should continue to take his Eliquis as previously directed.  This provider called to review all ultrasound findings with the patient this afternoon and also reviewed findings with Dr. Alvy Bimler as well.

## 2015-11-29 NOTE — Progress Notes (Signed)
SYMPTOM MANAGEMENT CLINIC    Chief Complaint: Lower extremity superficial thrombosis.  HPI:  Ross Hansen 30 y.o. male diagnosed with Hodgkin's lymphoma.  Currently undergoing ABVD chemotherapy regimen.     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      07/28/2015 Pathology Results    Accession: [id number] core needle biopsy of the right supraclavicular region came back positive for classical Hodgkin lymphoma      08/22/2015 Bone Marrow Biopsy    Accession: [id number] Bone marrow is negative for involvement      08/22/2015 Procedure    He has port placement and bone marrow biopsy      08/23/2015 Imaging    ECHO showed normal EF      08/24/2015 PET scan    Bilateral hypermetabolic supraclavicular lymphadenopathy and mediastinal adenopathy consistent with known Hodgkin's lymphoma. 2. No disease is identified below the diaphragms.      08/25/2015 -  Chemotherapy    He is started on ABVD      09/19/2015 Imaging    Superficial thrombosis is noted in the left greater saphenous vein, from distal thigh extending up to groin. Non-occlusive DVT is extending into the left common femoral vein, at the   saphenofemoral junction.      09/22/2015 Adverse Reaction    Due to cough, Bleomycin is placed on hold      10/14/2015 PET scan    Marked reduction in size of the the thoracic and supraclavicular adenopathy, with interval resolution of the prior hypermetabolic activity indicating response to effective therapy. No significant residual hypermetabolic adenopathy.        Review of Systems  Skin:       Left lower extremity medial area of erythema and firmness.  All other systems reviewed and are negative.   Past Medical History:  Diagnosis Date  . Allergy   . Cancer [facility])    hodgkin lymphoma  . Familial multiple factor deficiency syndrome, type IV (HCC)    "Factor IV Leiden"    Past Surgical History:  Procedure Laterality Date  . TONSILLECTOMY    . 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    has No Known Allergies.    Medication List       Accurate as of 11/29/15  2:30 PM. Always use your most recent med list.          amoxicillin-clavulanate 875-125 MG tablet Commonly known as:  AUGMENTIN Take 1 tablet by mouth 2 (two) times daily.   dexamethasone 4 MG tablet Commonly known as:  DECADRON Take 4-8 mg by mouth See admin instructions. Pt is to take two tablets once daily on the day after chemo and then two tablets two times daily for two days.   ibuprofen 200 MG tablet Commonly known as:  ADVIL,MOTRIN Take 400-600  mg by mouth every 6 (six) hours as needed for headache.   lidocaine-prilocaine cream Commonly known as:  EMLA Apply 1 application topically as needed (prior to accessing port).   LORazepam 0.5 MG tablet Commonly known as:  ATIVAN Take 1 tablet (0.5 mg total) by mouth 2 (two) times daily as needed for anxiety.   ondansetron 8 MG tablet Commonly known as:  ZOFRAN Take 8 mg by mouth 2 (two) times daily as needed for nausea or vomiting.   prochlorperazine 10 MG tablet Commonly known as:  COMPAZINE Take 10 mg by mouth every 6 (six) hours as needed for nausea or  vomiting.   rivaroxaban 20 MG Tabs tablet Commonly known as:  XARELTO Take 1 tablet (20 mg total) by mouth daily with supper.        PHYSICAL EXAMINATION  Oncology Vitals 11/29/2015 11/25/2015  Height - 183 cm  Weight 67.359 kg 68.539 kg  Weight (lbs) 148 lbs 8 oz 151 lbs 2 oz  BMI (kg/m2) 20.14 kg/m2 20.49 kg/m2  Temp 98.5 98.3  Pulse 73 72  Resp 16 18  SpO2 100 100  BSA (m2) 1.85 m2 1.87 m2   BP Readings from Last 2 Encounters:  11/29/15 128/77  11/25/15 (!) 145/98    Physical Exam  Constitutional: He is oriented to person, place, and time and well-developed, well-nourished, and in no distress.  HENT:  Head: Normocephalic and atraumatic.  Eyes: Pupils are equal, round, and reactive to light.  Neck: Normal range of motion.  Pulmonary/Chest: He is in respiratory distress.  Musculoskeletal: Normal range of motion. He exhibits edema. He exhibits no tenderness.  Neurological: He is alert and oriented to person, place, and time. Gait normal.  Skin: Skin is warm and dry. No rash noted. There is erythema. No pallor.  Exam today reveals that the left medial lower leg.  Site has become slightly firmer and enlarged.  Is also a little more red as well.  There are no red streaks, or warmth to the site.    Psychiatric: Affect normal.  Nursing note and vitals reviewed.   LABORATORY DATA:. Appointment on 11/29/2015  Component Date Value Ref Range Status  . WBC 11/29/2015 6.8  4.0 - 10.3 10e3/uL Final  . NEUT# 11/29/2015 4.1  1.5 - 6.5 10e3/uL Final  . HGB 11/29/2015 14.2  13.0 - 17.1 g/dL Final  . HCT 11/29/2015 42.3  38.4 - 49.9 % Final  . Platelets 11/29/2015 246  140 - 400 10e3/uL Final  . MCV 11/29/2015 92.8  79.3 - 98.0 fL Final  . MCH 11/29/2015 31.1  27.2 - 33.4 pg Final  . MCHC 11/29/2015 33.5  32.0 - 36.0 g/dL Final  . RBC 11/29/2015 4.56  4.20 - 5.82 10e6/uL Final  . RDW 11/29/2015 16.5* 11.0 - 14.6 % Final  . lymph# 11/29/2015 1.0  0.9 - 3.3 10e3/uL Final  . MONO#  11/29/2015 1.1* 0.1 - 0.9 10e3/uL Final  . Eosinophils Absolute 11/29/2015 0.4  0.0 - 0.5 10e3/uL Final  . Basophils Absolute 11/29/2015 0.1  0.0 - 0.1 10e3/uL Final  . NEUT% 11/29/2015 61.1  39.0 - 75.0 % Final  . LYMPH% 11/29/2015 15.4  14.0 - 49.0 % Final  . MONO% 11/29/2015 17.0* 0.0 - 14.0 % Final  . EOS% 11/29/2015 5.2  0.0 - 7.0 % Final  . BASO% 11/29/2015 1.3  0.0 - 2.0 % Final  . Sodium 11/29/2015 140  136 - 145 mEq/L Final  . Potassium 11/29/2015 4.2  3.5 -  5.1 mEq/L Final  . Chloride 11/29/2015 106  98 - 109 mEq/L Final  . CO2 11/29/2015 26  22 - 29 mEq/L Final  . Glucose 11/29/2015 83  70 - 140 mg/dl Final  . BUN 11/29/2015 4.8* 7.0 - 26.0 mg/dL Final  . Creatinine 11/29/2015 0.9  0.7 - 1.3 mg/dL Final  . Total Bilirubin 11/29/2015 0.48  0.20 - 1.20 mg/dL Final  . Alkaline Phosphatase 11/29/2015 81  40 - 150 U/L Final  . AST 11/29/2015 18  5 - 34 U/L Final  . ALT 11/29/2015 20  0 - 55 U/L Final  . Total Protein 11/29/2015 6.8  6.4 - 8.3 g/dL Final  . Albumin 11/29/2015 3.7  3.5 - 5.0 g/dL Final  . Calcium 11/29/2015 9.2  8.4 - 10.4 mg/dL Final  . Anion Gap 11/29/2015 8  3 - 11 mEq/L Final  . EGFR 11/29/2015 >90  >90 ml/min/1.73 m2 Final    RADIOGRAPHIC STUDIES: No results found.  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  He continues to take Eliquis is previously directed; but admits to missing some intermittent doses in the past.  He had noted a left medial lump to his lower leg a week or so ago; and initially thought that it was cellulitis once again.  Patient has history of chronic cellulitis as well; and our he had Keflex at home to take if he develops any new cellulitis.  Patient states that he began taking the Keflex; but did not see any improvement in the site.  He denies any other new lesions whatsoever.  He denies any chest pain, chest pressure, shortness breath  with inspiration.  Exam today reveals that the left medial lower leg.  Site has become slightly firmer and enlarged.  Is also a little more red as well.  There are no red streaks, or warmth to the site.  Doppler ultrasound obtained today revealed new onset left lower extremity superficial thrombosis.  Patient was advised that he could discontinue the use of the Keflex antibiotics.  He should elevate his legs whenever possible; and he could use compresses to the site as well.  He should continue to take his Eliquis as previously directed.  This provider called to review all ultrasound findings with the patient this afternoon and also reviewed findings with Dr. Alvy Bimler as well.  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  He is scheduled to return on 12/01/2015 for labs, flush, visit, and his next cycle of chemotherapy.   Patient stated understanding of all instructions; and was in agreement with this plan of care. The patient knows to call the clinic with any problems, questions or concerns.   Total time spent with patient was 25 minutes;  with greater than 75 percent of that time spent in face to face counseling regarding patient's symptoms,  and coordination of care and follow up.  Disclaimer:This dictation was prepared with Dragon/digital dictation along with [employer]. Any transcriptional errors that result from this process are unintentional.  Drue Second, NP 11/29/2015

## 2015-11-29 NOTE — Progress Notes (Addendum)
**  Preliminary report by tech**  Left lower extremity venous duplex completed. There is evidence of acute superficial vein thrombosis involving the great saphenous vein, extending from the proximal thigh to the mid calf, of the left lower extremity.  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 There is no evidence of deep vein thrombosis involving the left lower extremity. There is no evidence of a Baker's cyst on the left. Results given to Drue Second.  11/29/15 1:31 PM Ross Hansen RVT

## 2015-11-29 NOTE — Telephone Encounter (Signed)
Called patient to review the left lower extremity Doppler results.  Patient was found to have a new left lower extremity superficial thrombus.  He had no acute DVTs; but was noted to have some old, chronic DVTs.  Patient was advised to elevate his leg above the level of his heart whenever possible; and he could also use some compresses to the site.  Patient was advised to call/return or go directly to the emergency department for any worsening symptoms whatsoever.

## 2015-11-29 NOTE — Telephone Encounter (Signed)
NO 10/3 LOS/ORDERS/REFERRALS °

## 2015-11-30 ENCOUNTER — Other Ambulatory Visit: Payer: Self-pay | Admitting: Hematology and Oncology

## 2015-11-30 NOTE — Assessment & Plan Note (Signed)
I spent some time counseling the patient the importance of tobacco cessation. he is currently attempting to quit on his own 

## 2015-11-30 NOTE — Assessment & Plan Note (Signed)
Recent herpes zoster has resolved Will resume treatment today

## 2015-11-30 NOTE — Assessment & Plan Note (Signed)
The patient had recent diagnosis of blood clot. He has recent missing doses Recent venous Doppler US revealed superficial thrombophlebitis I reinforced the importance of compliance

## 2015-11-30 NOTE — Progress Notes (Signed)
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      07/28/2015 Pathology Results    Accession: [id number] core needle biopsy of the right supraclavicular region came back positive for classical Hodgkin lymphoma      08/22/2015 Bone Marrow Biopsy    Accession: [id number] Bone marrow is negative for involvement      08/22/2015 Procedure    He has port placement and bone marrow biopsy      08/23/2015 Imaging    ECHO showed normal EF      08/24/2015 PET scan    Bilateral hypermetabolic supraclavicular lymphadenopathy and mediastinal adenopathy consistent with known Hodgkin's lymphoma. 2. No disease is identified below the diaphragms.      08/25/2015 -  Chemotherapy    He is started on ABVD      09/19/2015 Imaging    Superficial thrombosis is noted in the left greater saphenous vein, from distal thigh extending up to groin. Non-occlusive DVT is extending into the left common femoral vein, at the   saphenofemoral junction.      09/22/2015 Adverse Reaction    Due to cough, Bleomycin is placed on hold      10/14/2015 PET scan    Marked reduction in size of the the thoracic and supraclavicular adenopathy, with interval resolution of the prior hypermetabolic activity indicating response to effective therapy. No significant residual hypermetabolic adenopathy.       INTERVAL HISTORY: Please see below for problem oriented  charting. He was seen recently due to concern for possible infection. Venous Doppler ultrasound revealed superficial thrombophlebitis. His recent herpes zoster has resolved. He returns for cycle 4 of treatment  REVIEW OF SYSTEMS:   Constitutional: Denies fevers, chills or abnormal weight loss Eyes: Denies blurriness of vision Ears, nose, mouth, throat, and face: Denies mucositis or sore throat Respiratory: Denies cough, dyspnea or wheezes Cardiovascular: Denies palpitation, chest discomfort or lower extremity swelling Gastrointestinal:  Denies nausea, heartburn or change in bowel habits Skin: Denies abnormal skin rashes Lymphatics: Denies new lymphadenopathy or easy bruising Neurological:Denies numbness, tingling or new weaknesses Behavioral/Psych: Mood is stable, no new changes  All other systems were reviewed with the patient and are negative.  I have reviewed the past medical history, past surgical history, social history and family history with the patient and they are unchanged from previous note.  ALLERGIES:  has No Known Allergies.  MEDICATIONS:  Current Outpatient Prescriptions  Medication Sig Dispense Refill  . dexamethasone (DECADRON) 4 MG tablet Take 4-8 mg by mouth See admin instructions. Pt is to take two tablets once daily on the day after chemo and then two tablets two times daily for two days.    Marland Kitchen ibuprofen (ADVIL,MOTRIN) 200 MG tablet Take 400-600 mg by mouth every 6 (six) hours as needed for headache.    . lidocaine-prilocaine (EMLA) cream Apply 1 application topically as needed (prior to accessing port).    . LORazepam (ATIVAN) 0.5 MG tablet Take 1 tablet (0.5 mg  total) by mouth 2 (two) times daily as needed for anxiety. 30 tablet 0  . ondansetron (ZOFRAN) 8 MG tablet Take 8 mg by mouth 2 (two) times daily as needed for nausea or vomiting.    . prochlorperazine (COMPAZINE) 10 MG tablet Take 10 mg by mouth every 6 (six) hours as needed for nausea or vomiting.    .  rivaroxaban (XARELTO) 20 MG TABS tablet Take 1 tablet (20 mg total) by mouth daily with supper. 30 tablet 9   No current facility-administered medications for this visit.     PHYSICAL EXAMINATION: ECOG PERFORMANCE STATUS: 0 - Asymptomatic  Vitals:   12/01/15 0902  BP: 121/62  Pulse: 72  Resp: 18  Temp: 98.2 F (36.8 C)   Filed Weights   12/01/15 0902  Weight: 148 lb 6.4 oz (67.3 kg)    GENERAL:alert, no distress and comfortable SKIN: skin color, texture, turgor are normal, no rashes or significant lesions EYES: normal, Conjunctiva are pink and non-injected, sclera clear OROPHARYNX:no exudate, no erythema and lips, buccal mucosa, and tongue normal  NECK: supple, thyroid normal size, non-tender, without nodularity LYMPH:  no palpable lymphadenopathy in the cervical, axillary or inguinal LUNGS: clear to auscultation and percussion with normal breathing effort HEART: regular rate & rhythm and no murmurs and no lower extremity edema ABDOMEN:abdomen soft, non-tender and normal bowel sounds Musculoskeletal:no cyanosis of digits and no clubbing  NEURO: alert & oriented x 3 with fluent speech, no focal motor/sensory deficits  LABORATORY DATA:  I have reviewed the data as listed    Component Value Date/Time   NA 140 11/29/2015 1028   K 4.2 11/29/2015 1028   CL 102 09/29/2015 0218   CO2 26 11/29/2015 1028   GLUCOSE 83 11/29/2015 1028   BUN 4.8 (L) 11/29/2015 1028   CREATININE 0.9 11/29/2015 1028   CALCIUM 9.2 11/29/2015 1028   PROT 6.8 11/29/2015 1028   ALBUMIN 3.7 11/29/2015 1028   AST 18 11/29/2015 1028   ALT 20 11/29/2015 1028   ALKPHOS 81 11/29/2015 1028   BILITOT 0.48 11/29/2015 1028   GFRNONAA >60 09/29/2015 0218   GFRAA >60 09/29/2015 0218    No results found for: SPEP, UPEP  Lab Results  Component Value Date   WBC 6.8 11/29/2015   NEUTROABS 4.1 11/29/2015   HGB 14.2 11/29/2015   HCT 42.3 11/29/2015   MCV 92.8 11/29/2015   PLT 246 11/29/2015       Chemistry      Component Value Date/Time   NA 140 11/29/2015 1028   K 4.2 11/29/2015 1028   CL 102 09/29/2015 0218   CO2 26 11/29/2015 1028   BUN 4.8 (L) 11/29/2015 1028   CREATININE 0.9 11/29/2015 1028      Component Value Date/Time   CALCIUM 9.2 11/29/2015 1028   ALKPHOS 81 11/29/2015 1028   AST 18 11/29/2015 1028   ALT 20 11/29/2015 1028   BILITOT 0.48 11/29/2015 1028     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 He has recent missing doses Recent venous Doppler US revealed superficial thrombophlebitis I reinforced the importance of compliance  Tobacco abuse I spent some time counseling the patient the importance of tobacco cessation. he is currently attempting to quit on his own    No orders of  the defined types were placed in this encounter.  All questions were answered. The patient knows to call the clinic with any problems, questions or concerns. No barriers to learning was detected. I spent 15 minutes counseling the patient face to face. The total time spent in the appointment was 20 minutes and more than 50% was on counseling and review of test results     Heath Lark, MD 12/01/2015 9:17 AM

## 2015-12-01 ENCOUNTER — Ambulatory Visit (HOSPITAL_BASED_OUTPATIENT_CLINIC_OR_DEPARTMENT_OTHER): Payer: No Typology Code available for payment source

## 2015-12-01 ENCOUNTER — Encounter: Payer: Self-pay | Admitting: Hematology and Oncology

## 2015-12-01 ENCOUNTER — Telehealth: Payer: Self-pay | Admitting: Hematology and Oncology

## 2015-12-01 ENCOUNTER — Other Ambulatory Visit: Payer: No Typology Code available for payment source

## 2015-12-01 ENCOUNTER — Ambulatory Visit (HOSPITAL_BASED_OUTPATIENT_CLINIC_OR_DEPARTMENT_OTHER): Payer: No Typology Code available for payment source | Admitting: Hematology and Oncology

## 2015-12-01 DIAGNOSIS — Z720 Tobacco use: Secondary | ICD-10-CM

## 2015-12-01 DIAGNOSIS — I82412 Acute embolism and thrombosis of left femoral vein: Secondary | ICD-10-CM | POA: Diagnosis not present

## 2015-12-01 DIAGNOSIS — Z5111 Encounter for antineoplastic chemotherapy: Secondary | ICD-10-CM | POA: Diagnosis not present

## 2015-12-01 DIAGNOSIS — C8118 Nodular sclerosis classical Hodgkin lymphoma, lymph nodes of multiple sites: Secondary | ICD-10-CM

## 2015-12-01 MED ORDER — PALONOSETRON HCL INJECTION 0.25 MG/5ML
0.2500 mg | Freq: Once | INTRAVENOUS | Status: AC
  Administered 2015-12-01: 0.25 mg via INTRAVENOUS

## 2015-12-01 MED ORDER — HEPARIN SOD (PORK) LOCK FLUSH 100 UNIT/ML IV SOLN
500.0000 [IU] | Freq: Once | INTRAVENOUS | Status: AC | PRN
  Administered 2015-12-01: 500 [IU]
  Filled 2015-12-01: qty 5

## 2015-12-01 MED ORDER — SODIUM CHLORIDE 0.9 % IV SOLN
375.0000 mg/m2 | Freq: Once | INTRAVENOUS | Status: AC
  Administered 2015-12-01: 700 mg via INTRAVENOUS
  Filled 2015-12-01: qty 35

## 2015-12-01 MED ORDER — PALONOSETRON HCL INJECTION 0.25 MG/5ML
INTRAVENOUS | Status: AC
  Filled 2015-12-01: qty 5

## 2015-12-01 MED ORDER — DOXORUBICIN HCL CHEMO IV INJECTION 2 MG/ML
25.0000 mg/m2 | Freq: Once | INTRAVENOUS | Status: AC
  Administered 2015-12-01: 46 mg via INTRAVENOUS
  Filled 2015-12-01: qty 23

## 2015-12-01 MED ORDER — SODIUM CHLORIDE 0.9% FLUSH
10.0000 mL | INTRAVENOUS | Status: DC | PRN
  Administered 2015-12-01: 10 mL
  Filled 2015-12-01: qty 10

## 2015-12-01 MED ORDER — SODIUM CHLORIDE 0.9 % IV SOLN
Freq: Once | INTRAVENOUS | Status: AC
  Administered 2015-12-01: 10:00:00 via INTRAVENOUS
  Filled 2015-12-01: qty 5

## 2015-12-01 MED ORDER — VINBLASTINE SULFATE CHEMO INJECTION 1 MG/ML
5.9500 mg/m2 | Freq: Once | INTRAVENOUS | Status: AC
  Administered 2015-12-01: 11 mg via INTRAVENOUS
  Filled 2015-12-01: qty 11

## 2015-12-01 MED ORDER — SODIUM CHLORIDE 0.9 % IV SOLN
Freq: Once | INTRAVENOUS | Status: AC
  Administered 2015-12-01: 10:00:00 via INTRAVENOUS

## 2015-12-01 NOTE — Telephone Encounter (Signed)
Gave patient avs report and appointments for October and November.  °

## 2015-12-01 NOTE — Patient Instructions (Signed)
[facility] Discharge Instructions for Patients Receiving Chemotherapy  Today you received the following chemotherapy agents adriamycin/vinblastine/dacarbazine   To help prevent nausea and vomiting after your treatment, we encourage you to take your nausea medication as directed  If you develop nausea and vomiting that is not controlled by your nausea medication, call the clinic.   BELOW ARE SYMPTOMS THAT SHOULD BE REPORTED IMMEDIATELY:  *FEVER GREATER THAN 100.5 F  *CHILLS WITH OR WITHOUT FEVER  NAUSEA AND VOMITING THAT IS NOT CONTROLLED WITH YOUR NAUSEA MEDICATION  *UNUSUAL SHORTNESS OF BREATH  *UNUSUAL BRUISING OR BLEEDING  TENDERNESS IN MOUTH AND THROAT WITH OR WITHOUT PRESENCE OF ULCERS  *URINARY PROBLEMS  *BOWEL PROBLEMS  UNUSUAL RASH Items with * indicate a potential emergency and should be followed up as soon as possible.  Feel free to call the clinic you have any questions or concerns. The clinic phone number is [phone].  

## 2015-12-15 ENCOUNTER — Other Ambulatory Visit: Payer: Self-pay | Admitting: Hematology and Oncology

## 2015-12-15 ENCOUNTER — Other Ambulatory Visit (HOSPITAL_BASED_OUTPATIENT_CLINIC_OR_DEPARTMENT_OTHER): Payer: No Typology Code available for payment source

## 2015-12-15 ENCOUNTER — Ambulatory Visit (HOSPITAL_BASED_OUTPATIENT_CLINIC_OR_DEPARTMENT_OTHER): Payer: No Typology Code available for payment source

## 2015-12-15 VITALS — BP 128/75 | HR 64 | Temp 98.4°F

## 2015-12-15 DIAGNOSIS — C8118 Nodular sclerosis classical Hodgkin lymphoma, lymph nodes of multiple sites: Secondary | ICD-10-CM

## 2015-12-15 DIAGNOSIS — Z23 Encounter for immunization: Secondary | ICD-10-CM

## 2015-12-15 DIAGNOSIS — Z5111 Encounter for antineoplastic chemotherapy: Secondary | ICD-10-CM | POA: Diagnosis not present

## 2015-12-15 LAB — CBC WITH DIFFERENTIAL/PLATELET
BASO%: 1.7 % (ref 0.0–2.0)
Basophils Absolute: 0.1 10*3/uL (ref 0.0–0.1)
EOS%: 11.3 % — AB (ref 0.0–7.0)
Eosinophils Absolute: 0.3 10*3/uL (ref 0.0–0.5)
HCT: 38.9 % (ref 38.4–49.9)
HGB: 13.3 g/dL (ref 13.0–17.1)
LYMPH%: 25.4 % (ref 14.0–49.0)
MCH: 31.1 pg (ref 27.2–33.4)
MCHC: 34.2 g/dL (ref 32.0–36.0)
MCV: 91.1 fL (ref 79.3–98.0)
MONO#: 0.5 10*3/uL (ref 0.1–0.9)
MONO%: 17.9 % — ABNORMAL HIGH (ref 0.0–14.0)
NEUT%: 43.7 % (ref 39.0–75.0)
NEUTROS ABS: 1.3 10*3/uL — AB (ref 1.5–6.5)
Platelets: 193 10*3/uL (ref 140–400)
RBC: 4.27 10*6/uL (ref 4.20–5.82)
RDW: 14.6 % (ref 11.0–14.6)
WBC: 2.9 10*3/uL — AB (ref 4.0–10.3)
lymph#: 0.7 10*3/uL — ABNORMAL LOW (ref 0.9–3.3)

## 2015-12-15 LAB — COMPREHENSIVE METABOLIC PANEL
ALT: 31 U/L (ref 0–55)
ANION GAP: 8 meq/L (ref 3–11)
AST: 24 U/L (ref 5–34)
Albumin: 3.8 g/dL (ref 3.5–5.0)
Alkaline Phosphatase: 59 U/L (ref 40–150)
BILIRUBIN TOTAL: 0.34 mg/dL (ref 0.20–1.20)
BUN: 4.6 mg/dL — AB (ref 7.0–26.0)
CHLORIDE: 107 meq/L (ref 98–109)
CO2: 25 meq/L (ref 22–29)
CREATININE: 0.8 mg/dL (ref 0.7–1.3)
Calcium: 9 mg/dL (ref 8.4–10.4)
EGFR: >90 mL/min/{1.73_m2} (ref >90)
GLUCOSE: 113 mg/dL (ref 70–140)
Potassium: 3.7 mEq/L (ref 3.5–5.1)
SODIUM: 140 meq/L (ref 136–145)
TOTAL PROTEIN: 6.5 g/dL (ref 6.4–8.3)

## 2015-12-15 MED ORDER — SODIUM CHLORIDE 0.9 % IV SOLN
Freq: Once | INTRAVENOUS | Status: AC
  Administered 2015-12-15: 10:00:00 via INTRAVENOUS
  Filled 2015-12-15: qty 5

## 2015-12-15 MED ORDER — INFLUENZA VAC SPLIT QUAD 0.5 ML IM SUSY
0.5000 mL | PREFILLED_SYRINGE | Freq: Once | INTRAMUSCULAR | Status: AC
  Administered 2015-12-15: 0.5 mL via INTRAMUSCULAR
  Filled 2015-12-15: qty 0.5

## 2015-12-15 MED ORDER — PALONOSETRON HCL INJECTION 0.25 MG/5ML
INTRAVENOUS | Status: AC
  Filled 2015-12-15: qty 5

## 2015-12-15 MED ORDER — SODIUM CHLORIDE 0.9 % IV SOLN
Freq: Once | INTRAVENOUS | Status: AC
  Administered 2015-12-15: 09:00:00 via INTRAVENOUS

## 2015-12-15 MED ORDER — SODIUM CHLORIDE 0.9 % IV SOLN
5.9500 mg/m2 | Freq: Once | INTRAVENOUS | Status: AC
  Administered 2015-12-15: 11 mg via INTRAVENOUS
  Filled 2015-12-15: qty 11

## 2015-12-15 MED ORDER — PALONOSETRON HCL INJECTION 0.25 MG/5ML
0.2500 mg | Freq: Once | INTRAVENOUS | Status: AC
  Administered 2015-12-15: 0.25 mg via INTRAVENOUS

## 2015-12-15 MED ORDER — DOXORUBICIN HCL CHEMO IV INJECTION 2 MG/ML
25.0000 mg/m2 | Freq: Once | INTRAVENOUS | Status: AC
  Administered 2015-12-15: 46 mg via INTRAVENOUS
  Filled 2015-12-15: qty 23

## 2015-12-15 MED ORDER — HEPARIN SOD (PORK) LOCK FLUSH 100 UNIT/ML IV SOLN
500.0000 [IU] | Freq: Once | INTRAVENOUS | Status: AC | PRN
  Administered 2015-12-15: 500 [IU]
  Filled 2015-12-15: qty 5

## 2015-12-15 MED ORDER — SODIUM CHLORIDE 0.9% FLUSH
10.0000 mL | INTRAVENOUS | Status: DC | PRN
  Administered 2015-12-15: 10 mL
  Filled 2015-12-15: qty 10

## 2015-12-15 MED ORDER — SODIUM CHLORIDE 0.9 % IV SOLN
375.0000 mg/m2 | Freq: Once | INTRAVENOUS | Status: AC
  Administered 2015-12-15: 700 mg via INTRAVENOUS
  Filled 2015-12-15: qty 35

## 2015-12-15 NOTE — Patient Instructions (Signed)
[facility] Discharge Instructions for Patients Receiving Chemotherapy  Today you received the following chemotherapy agents Adriamycin, Vinblastine and DTIC. To help prevent nausea and vomiting after your treatment, we encourage you to take your nausea medication as directed. NO ZOFRAN FOR 3 DAYS.   If you develop nausea and vomiting that is not controlled by your nausea medication, call the clinic.   BELOW ARE SYMPTOMS THAT SHOULD BE REPORTED IMMEDIATELY:  *FEVER GREATER THAN 100.5 F  *CHILLS WITH OR WITHOUT FEVER  NAUSEA AND VOMITING THAT IS NOT CONTROLLED WITH YOUR NAUSEA MEDICATION  *UNUSUAL SHORTNESS OF BREATH  *UNUSUAL BRUISING OR BLEEDING  TENDERNESS IN MOUTH AND THROAT WITH OR WITHOUT PRESENCE OF ULCERS  *URINARY PROBLEMS  *BOWEL PROBLEMS  UNUSUAL RASH Items with * indicate a potential emergency and should be followed up as soon as possible.  Feel free to call the clinic you have any questions or concerns. The clinic phone number is [phone].  Please show the [place] at check-in to the Emergency Department and triage nurse.

## 2015-12-29 ENCOUNTER — Ambulatory Visit: Payer: No Typology Code available for payment source

## 2015-12-29 ENCOUNTER — Encounter: Payer: Self-pay | Admitting: Hematology and Oncology

## 2015-12-29 ENCOUNTER — Ambulatory Visit (HOSPITAL_BASED_OUTPATIENT_CLINIC_OR_DEPARTMENT_OTHER): Payer: No Typology Code available for payment source | Admitting: Hematology and Oncology

## 2015-12-29 ENCOUNTER — Other Ambulatory Visit (HOSPITAL_BASED_OUTPATIENT_CLINIC_OR_DEPARTMENT_OTHER): Payer: No Typology Code available for payment source

## 2015-12-29 ENCOUNTER — Ambulatory Visit (HOSPITAL_BASED_OUTPATIENT_CLINIC_OR_DEPARTMENT_OTHER): Payer: No Typology Code available for payment source

## 2015-12-29 ENCOUNTER — Telehealth: Payer: Self-pay | Admitting: Hematology and Oncology

## 2015-12-29 DIAGNOSIS — C8118 Nodular sclerosis classical Hodgkin lymphoma, lymph nodes of multiple sites: Secondary | ICD-10-CM | POA: Diagnosis not present

## 2015-12-29 DIAGNOSIS — I82412 Acute embolism and thrombosis of left femoral vein: Secondary | ICD-10-CM

## 2015-12-29 DIAGNOSIS — Z95828 Presence of other vascular implants and grafts: Secondary | ICD-10-CM

## 2015-12-29 DIAGNOSIS — Z5111 Encounter for antineoplastic chemotherapy: Secondary | ICD-10-CM | POA: Diagnosis not present

## 2015-12-29 DIAGNOSIS — L270 Generalized skin eruption due to drugs and medicaments taken internally: Secondary | ICD-10-CM | POA: Diagnosis not present

## 2015-12-29 DIAGNOSIS — D702 Other drug-induced agranulocytosis: Secondary | ICD-10-CM

## 2015-12-29 DIAGNOSIS — R05 Cough: Secondary | ICD-10-CM

## 2015-12-29 DIAGNOSIS — F419 Anxiety disorder, unspecified: Secondary | ICD-10-CM

## 2015-12-29 DIAGNOSIS — R21 Rash and other nonspecific skin eruption: Secondary | ICD-10-CM | POA: Insufficient documentation

## 2015-12-29 DIAGNOSIS — F411 Generalized anxiety disorder: Secondary | ICD-10-CM

## 2015-12-29 DIAGNOSIS — Z720 Tobacco use: Secondary | ICD-10-CM

## 2015-12-29 LAB — CBC WITH DIFFERENTIAL/PLATELET
BASO%: 2.3 % — ABNORMAL HIGH (ref 0.0–2.0)
Basophils Absolute: 0.1 10*3/uL (ref 0.0–0.1)
EOS ABS: 0.2 10*3/uL (ref 0.0–0.5)
EOS%: 6.3 % (ref 0.0–7.0)
HCT: 39.7 % (ref 38.4–49.9)
HEMOGLOBIN: 13.3 g/dL (ref 13.0–17.1)
LYMPH#: 0.7 10*3/uL — AB (ref 0.9–3.3)
LYMPH%: 28.9 % (ref 14.0–49.0)
MCH: 31.2 pg (ref 27.2–33.4)
MCHC: 33.4 g/dL (ref 32.0–36.0)
MCV: 93.5 fL (ref 79.3–98.0)
MONO#: 0.6 10*3/uL (ref 0.1–0.9)
MONO%: 24.8 % — AB (ref 0.0–14.0)
NEUT%: 37.7 % — ABNORMAL LOW (ref 39.0–75.0)
NEUTROS ABS: 1 10*3/uL — AB (ref 1.5–6.5)
Platelets: 210 10*3/uL (ref 140–400)
RBC: 4.24 10*6/uL (ref 4.20–5.82)
RDW: 15.2 % — AB (ref 11.0–14.6)
WBC: 2.5 10*3/uL — AB (ref 4.0–10.3)

## 2015-12-29 LAB — COMPREHENSIVE METABOLIC PANEL
ALBUMIN: 3.7 g/dL (ref 3.5–5.0)
ALK PHOS: 60 U/L (ref 40–150)
ALT: 21 U/L (ref 0–55)
AST: 17 U/L (ref 5–34)
Anion Gap: 6 mEq/L (ref 3–11)
BILIRUBIN TOTAL: 0.29 mg/dL (ref 0.20–1.20)
CO2: 25 mEq/L (ref 22–29)
CREATININE: 0.8 mg/dL (ref 0.7–1.3)
Calcium: 8.9 mg/dL (ref 8.4–10.4)
Chloride: 108 mEq/L (ref 98–109)
EGFR: >90 mL/min/{1.73_m2} (ref >90)
GLUCOSE: 92 mg/dL (ref 70–140)
Potassium: 3.9 mEq/L (ref 3.5–5.1)
SODIUM: 139 meq/L (ref 136–145)
TOTAL PROTEIN: 6.5 g/dL (ref 6.4–8.3)

## 2015-12-29 MED ORDER — HEPARIN SOD (PORK) LOCK FLUSH 100 UNIT/ML IV SOLN
500.0000 [IU] | Freq: Once | INTRAVENOUS | Status: AC | PRN
  Administered 2015-12-29: 500 [IU]
  Filled 2015-12-29: qty 5

## 2015-12-29 MED ORDER — PALONOSETRON HCL INJECTION 0.25 MG/5ML
INTRAVENOUS | Status: AC
  Filled 2015-12-29: qty 5

## 2015-12-29 MED ORDER — FOSAPREPITANT DIMEGLUMINE INJECTION 150 MG
Freq: Once | INTRAVENOUS | Status: AC
  Administered 2015-12-29: 11:00:00 via INTRAVENOUS
  Filled 2015-12-29: qty 5

## 2015-12-29 MED ORDER — DOXORUBICIN HCL CHEMO IV INJECTION 2 MG/ML
25.0000 mg/m2 | Freq: Once | INTRAVENOUS | Status: AC
  Administered 2015-12-29: 46 mg via INTRAVENOUS
  Filled 2015-12-29: qty 23

## 2015-12-29 MED ORDER — SODIUM CHLORIDE 0.9 % IJ SOLN
10.0000 mL | INTRAMUSCULAR | Status: DC | PRN
  Administered 2015-12-29: 10 mL via INTRAVENOUS
  Filled 2015-12-29: qty 10

## 2015-12-29 MED ORDER — SODIUM CHLORIDE 0.9% FLUSH
10.0000 mL | INTRAVENOUS | Status: DC | PRN
  Administered 2015-12-29: 10 mL
  Filled 2015-12-29: qty 10

## 2015-12-29 MED ORDER — PALONOSETRON HCL INJECTION 0.25 MG/5ML
0.2500 mg | Freq: Once | INTRAVENOUS | Status: AC
  Administered 2015-12-29: 0.25 mg via INTRAVENOUS

## 2015-12-29 MED ORDER — SODIUM CHLORIDE 0.9 % IV SOLN
375.0000 mg/m2 | Freq: Once | INTRAVENOUS | Status: AC
  Administered 2015-12-29: 700 mg via INTRAVENOUS
  Filled 2015-12-29: qty 35

## 2015-12-29 MED ORDER — SODIUM CHLORIDE 0.9 % IV SOLN
Freq: Once | INTRAVENOUS | Status: AC
  Administered 2015-12-29: 11:00:00 via INTRAVENOUS

## 2015-12-29 MED ORDER — VINBLASTINE SULFATE CHEMO INJECTION 1 MG/ML
11.0000 mg | Freq: Once | INTRAVENOUS | Status: AC
  Administered 2015-12-29: 11 mg via INTRAVENOUS
  Filled 2015-12-29: qty 11

## 2015-12-29 NOTE — Assessment & Plan Note (Addendum)
The patient had recent diagnosis of blood clot. He denies missing doses Recent venous Doppler US revealed superficial thrombophlebitis I reinforced the importance of compliance

## 2015-12-29 NOTE — Assessment & Plan Note (Signed)
He tolerated last cycle of treatment well. We'll proceed with cycle 5 of treatment without dose adjustment.

## 2015-12-29 NOTE — Assessment & Plan Note (Signed)
This is related to dexamethasone. Recommend conservative management only

## 2015-12-29 NOTE — Progress Notes (Signed)
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      07/28/2015 Pathology Results    Accession: [id number] core needle biopsy of the right supraclavicular region came back positive for classical Hodgkin lymphoma      08/22/2015 Bone Marrow Biopsy    Accession: [id number] Bone marrow is negative for involvement      08/22/2015 Procedure    He has port placement and bone marrow biopsy      08/23/2015 Imaging    ECHO showed normal EF      08/24/2015 PET scan    Bilateral hypermetabolic supraclavicular lymphadenopathy and mediastinal adenopathy consistent with known Hodgkin's lymphoma. 2. No disease is identified below the diaphragms.      08/25/2015 -  Chemotherapy    He is started on ABVD      09/19/2015 Imaging    Superficial thrombosis is noted in the left greater saphenous vein, from distal thigh extending up to groin. Non-occlusive DVT is extending into the left common femoral vein, at the   saphenofemoral junction.      09/22/2015 Adverse Reaction    Due to cough, Bleomycin is placed on hold      10/14/2015 PET scan    Marked reduction in size of the the thoracic and supraclavicular adenopathy, with interval resolution of the prior hypermetabolic activity indicating response to effective therapy. No significant residual hypermetabolic adenopathy.       INTERVAL HISTORY: Please see below for problem oriented  charting. He returns for cycle 5 of treatment. His wife noticed a mild skin rash on his face. His anxiety is improved He denies needing to take much lorazepam He is compliant taking his anticoagulation treatment. The patient denies any recent signs or symptoms of bleeding such as spontaneous epistaxis, hematuria or hematochezia. The recent infection. He has mild chronic cough, nonproductive. He denies recent new lymphadenopathy. Denies peripheral neuropathy. Denies recent nausea. He continues to smoke manages to cut back  He has gained some weight  REVIEW OF SYSTEMS:   Constitutional: Denies fevers, chills or abnormal weight loss Eyes: Denies blurriness of vision Ears, nose, mouth, throat, and face: Denies mucositis or sore throat Cardiovascular: Denies palpitation, chest discomfort or lower extremity swelling Gastrointestinal:  Denies nausea, heartburn or change in bowel habits Lymphatics: Denies new lymphadenopathy or easy bruising Neurological:Denies numbness, tingling or new weaknesses Behavioral/Psych: Mood is stable, no new changes  All other systems were reviewed with the patient and are negative.  I have reviewed the past medical history, past surgical history, social history and family history with the patient and they are unchanged from previous note.  ALLERGIES:  has No Known Allergies.  MEDICATIONS:  Current Outpatient Prescriptions  Medication Sig Dispense Refill  . dexamethasone (DECADRON) 4 MG tablet Take 4-8 mg by mouth See admin instructions. Pt is to take two tablets once daily on the day after chemo and then two tablets two times daily for two days.    Marland Kitchen ibuprofen (ADVIL,MOTRIN) 200  MG tablet Take 400-600 mg by mouth every 6 (six) hours as needed for headache.    . lidocaine-prilocaine (EMLA) cream Apply 1 application topically as needed (prior to accessing port).    . LORazepam (ATIVAN) 0.5 MG tablet Take 1 tablet (0.5 mg total) by mouth 2 (two) times daily as  needed for anxiety. 30 tablet 0  . ondansetron (ZOFRAN) 8 MG tablet Take 8 mg by mouth 2 (two) times daily as needed for nausea or vomiting.    . prochlorperazine (COMPAZINE) 10 MG tablet Take 10 mg by mouth every 6 (six) hours as needed for nausea or vomiting.    . rivaroxaban (XARELTO) 20 MG TABS tablet Take 1 tablet (20 mg total) by mouth daily with supper. 30 tablet 9   No current facility-administered medications for this visit.     PHYSICAL EXAMINATION: ECOG PERFORMANCE STATUS: 1 - Symptomatic but completely ambulatory  Vitals:   12/29/15 0936  BP: 134/87  Pulse: 76  Resp: 18  Temp: 98.1 F (36.7 C)   Filed Weights   12/29/15 0936  Weight: 157 lb 8 oz (71.4 kg)    GENERAL:alert, no distress and comfortable SKIN:Noted mild skin rash on his face  EYES: normal, Conjunctiva are pink and non-injected, sclera clear OROPHARYNX:no exudate, no erythema and lips, buccal mucosa, and tongue normal  NECK: supple, thyroid normal size, non-tender, without nodularity LYMPH:  no palpable lymphadenopathy in the cervical, axillary or inguinal LUNGS: clear to auscultation and percussion with normal breathing effort HEART: regular rate & rhythm and no murmurs and no lower extremity edema ABDOMEN:abdomen soft, non-tender and normal bowel sounds Musculoskeletal:no cyanosis of digits and no clubbing  NEURO: alert & oriented x 3 with fluent speech, no focal motor/sensory deficits  LABORATORY DATA:  I have reviewed the data as listed    Component Value Date/Time   NA 140 12/15/2015 0848   K 3.7 12/15/2015 0848   CL 102 09/29/2015 0218   CO2 25 12/15/2015 0848   GLUCOSE 113 12/15/2015 0848   BUN 4.6 (L) 12/15/2015 0848   CREATININE 0.8 12/15/2015 0848   CALCIUM 9.0 12/15/2015 0848   PROT 6.5 12/15/2015 0848   ALBUMIN 3.8 12/15/2015 0848   AST 24 12/15/2015 0848   ALT 31 12/15/2015 0848   ALKPHOS 59 12/15/2015 0848   BILITOT 0.34 12/15/2015 0848   GFRNONAA >60 09/29/2015 0218   GFRAA  >60 09/29/2015 0218    No results found for: SPEP, UPEP  Lab Results  Component Value Date   WBC 2.5 (L) 12/29/2015   NEUTROABS 1.0 (L) 12/29/2015   HGB 13.3 12/29/2015   HCT 39.7 12/29/2015   MCV 93.5 12/29/2015   PLT 210 12/29/2015      Chemistry      Component Value Date/Time   NA 140 12/15/2015 0848   K 3.7 12/15/2015 0848   CL 102 09/29/2015 0218   CO2 25 12/15/2015 0848   BUN 4.6 (L) 12/15/2015 0848   CREATININE 0.8 12/15/2015 0848      Component Value Date/Time   CALCIUM 9.0 12/15/2015 0848   ALKPHOS 59 12/15/2015 0848   AST 24 12/15/2015 0848   ALT 31 12/15/2015 0848   BILITOT 0.34 12/15/2015 0848       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 We'll proceed with cycle 5 of treatment without dose adjustment.  Drug-induced neutropenia ([place]) This is likely due to recent  treatment. The patient denies recent history of fevers, cough, chills, diarrhea or dysuria. He is asymptomatic from the leukopenia. I will observe for now.  I will continue the chemotherapy at current dose without dosage adjustment.  If the leukopenia gets progressive worse in the future, I might have to delay his treatment or adjust the chemotherapy dose.  Acute deep vein thrombosis (DVT) of left femoral vein (HCC) The patient had recent diagnosis of blood clot. He denies missing doses Recent venous Doppler US revealed superficial thrombophlebitis I reinforced the importance of compliance  Tobacco abuse I spent some time counseling the patient the importance of tobacco cessation. he is currently attempting to quit on his own   Anxiety states The patient has significant anxiety with difficulties coping due to recent disability from the cancer diagnosis. I spent a lot of time encouraging the patient. Per previous discussion, I had prescribed lorazepam as needed for anxiety.  He denies   needing to use it frequently. he is coping better now   Drug-induced skin rash This is related to dexamethasone. Recommend conservative management only   No orders of the defined types were placed in this encounter.  All questions were answered. The patient knows to call the clinic with any problems, questions or concerns. No barriers to learning was detected. I spent 25 minutes counseling the patient face to face. The total time spent in the appointment was 30 minutes and more than 50% was on counseling and review of test results     Heath Lark, MD 12/29/2015 9:44 AM

## 2015-12-29 NOTE — Patient Instructions (Signed)
[place] Discharge Instructions for Patients Receiving Chemotherapy  Today you received the following chemotherapy agents Adriamycin, Vinblastine and DTIC. To help prevent nausea and vomiting after your treatment, we encourage you to take your nausea medication as directed. NO ZOFRAN FOR 3 DAYS.   If you develop nausea and vomiting that is not controlled by your nausea medication, call the clinic.   BELOW ARE SYMPTOMS THAT SHOULD BE REPORTED IMMEDIATELY:  *FEVER GREATER THAN 100.5 F  *CHILLS WITH OR WITHOUT FEVER  NAUSEA AND VOMITING THAT IS NOT CONTROLLED WITH YOUR NAUSEA MEDICATION  *UNUSUAL SHORTNESS OF BREATH  *UNUSUAL BRUISING OR BLEEDING  TENDERNESS IN MOUTH AND THROAT WITH OR WITHOUT PRESENCE OF ULCERS  *URINARY PROBLEMS  *BOWEL PROBLEMS  UNUSUAL RASH Items with * indicate a potential emergency and should be followed up as soon as possible.  Feel free to call the clinic you have any questions or concerns. The clinic phone number is [phone].  Please show the [facility] at check-in to the Emergency Department and triage nurse.

## 2015-12-29 NOTE — Assessment & Plan Note (Addendum)
The patient has significant anxiety with difficulties coping due to recent disability from the cancer diagnosis. I spent a lot of time encouraging the patient. Per previous discussion, I had prescribed lorazepam as needed for anxiety.  He denies  needing to use it frequently. he is coping better now

## 2015-12-29 NOTE — Assessment & Plan Note (Signed)
I spent some time counseling the patient the importance of tobacco cessation. he is currently attempting to quit on his own 

## 2015-12-29 NOTE — Assessment & Plan Note (Signed)
This is likely due to recent treatment. The patient denies recent history of fevers, cough, chills, diarrhea or dysuria. He is asymptomatic from the leukopenia. I will observe for now.  I will continue the chemotherapy at current dose without dosage adjustment.  If the leukopenia gets progressive worse in the future, I might have to delay his treatment or adjust the chemotherapy dose. 

## 2015-12-29 NOTE — Telephone Encounter (Signed)
Appointments scheduled per 11/2 LOS.

## 2015-12-30 ENCOUNTER — Telehealth: Payer: Self-pay | Admitting: *Deleted

## 2015-12-30 NOTE — Telephone Encounter (Signed)
Per LOS I have scheduled appr for 11/30, we are capped for 11/16, waiting on managerment. Notified the scheruler

## 2016-01-05 ENCOUNTER — Telehealth: Payer: Self-pay | Admitting: *Deleted

## 2016-01-05 NOTE — Telephone Encounter (Signed)
Per LOS I have scheduled appt to 11/17, no available on 11/16. Notified the scheduler

## 2016-01-12 ENCOUNTER — Ambulatory Visit (HOSPITAL_BASED_OUTPATIENT_CLINIC_OR_DEPARTMENT_OTHER): Payer: No Typology Code available for payment source

## 2016-01-12 ENCOUNTER — Other Ambulatory Visit (HOSPITAL_BASED_OUTPATIENT_CLINIC_OR_DEPARTMENT_OTHER): Payer: No Typology Code available for payment source

## 2016-01-12 ENCOUNTER — Ambulatory Visit (HOSPITAL_BASED_OUTPATIENT_CLINIC_OR_DEPARTMENT_OTHER): Payer: No Typology Code available for payment source | Admitting: Nurse Practitioner

## 2016-01-12 ENCOUNTER — Ambulatory Visit: Payer: No Typology Code available for payment source | Admitting: Nurse Practitioner

## 2016-01-12 ENCOUNTER — Encounter: Payer: Self-pay | Admitting: Nurse Practitioner

## 2016-01-12 ENCOUNTER — Other Ambulatory Visit: Payer: Self-pay | Admitting: Hematology and Oncology

## 2016-01-12 VITALS — BP 120/69 | HR 75 | Temp 98.7°F | Resp 18 | Ht 72.0 in | Wt 150.9 lb

## 2016-01-12 VITALS — BP 127/69 | HR 74 | Temp 97.8°F | Resp 18

## 2016-01-12 DIAGNOSIS — C8118 Nodular sclerosis classical Hodgkin lymphoma, lymph nodes of multiple sites: Secondary | ICD-10-CM | POA: Diagnosis not present

## 2016-01-12 DIAGNOSIS — Z5111 Encounter for antineoplastic chemotherapy: Secondary | ICD-10-CM | POA: Diagnosis not present

## 2016-01-12 DIAGNOSIS — R21 Rash and other nonspecific skin eruption: Secondary | ICD-10-CM

## 2016-01-12 LAB — COMPREHENSIVE METABOLIC PANEL
ALBUMIN: 3.8 g/dL (ref 3.5–5.0)
ALK PHOS: 55 U/L (ref 40–150)
ALT: 18 U/L (ref 0–55)
ANION GAP: 10 meq/L (ref 3–11)
AST: 18 U/L (ref 5–34)
BILIRUBIN TOTAL: 0.28 mg/dL (ref 0.20–1.20)
BUN: <4 mg/dL — ABNORMAL LOW (ref 7.0–26.0)
CALCIUM: 9.1 mg/dL (ref 8.4–10.4)
CO2: 24 mEq/L (ref 22–29)
CREATININE: 0.8 mg/dL (ref 0.7–1.3)
Chloride: 107 mEq/L (ref 98–109)
EGFR: >90 mL/min/{1.73_m2} (ref >90)
Glucose: 138 mg/dl (ref 70–140)
Potassium: 3.6 mEq/L (ref 3.5–5.1)
Sodium: 141 mEq/L (ref 136–145)
TOTAL PROTEIN: 6.5 g/dL (ref 6.4–8.3)

## 2016-01-12 LAB — CBC WITH DIFFERENTIAL/PLATELET
BASO%: 1.3 % (ref 0.0–2.0)
BASOS ABS: 0 10*3/uL (ref 0.0–0.1)
EOS ABS: 0.1 10*3/uL (ref 0.0–0.5)
EOS%: 5.2 % (ref 0.0–7.0)
HEMATOCRIT: 38.7 % (ref 38.4–49.9)
HGB: 13.4 g/dL (ref 13.0–17.1)
LYMPH%: 35.9 % (ref 14.0–49.0)
MCH: 31.4 pg (ref 27.2–33.4)
MCHC: 34.6 g/dL (ref 32.0–36.0)
MCV: 90.6 fL (ref 79.3–98.0)
MONO#: 0.5 10*3/uL (ref 0.1–0.9)
MONO%: 20.3 % — AB (ref 0.0–14.0)
NEUT%: 37.3 % — ABNORMAL LOW (ref 39.0–75.0)
NEUTROS ABS: 0.9 10*3/uL — AB (ref 1.5–6.5)
NRBC: 0 % (ref 0–0)
PLATELETS: 193 10*3/uL (ref 140–400)
RBC: 4.27 10*6/uL (ref 4.20–5.82)
RDW: 14.6 % (ref 11.0–14.6)
WBC: 2.3 10*3/uL — AB (ref 4.0–10.3)
lymph#: 0.8 10*3/uL — ABNORMAL LOW (ref 0.9–3.3)

## 2016-01-12 MED ORDER — SODIUM CHLORIDE 0.9% FLUSH
10.0000 mL | Freq: Once | INTRAVENOUS | Status: AC
  Administered 2016-01-12: 10 mL via INTRAVENOUS
  Filled 2016-01-12: qty 10

## 2016-01-12 MED ORDER — SODIUM CHLORIDE 0.9 % IV SOLN
Freq: Once | INTRAVENOUS | Status: AC
  Administered 2016-01-12: 11:00:00 via INTRAVENOUS
  Filled 2016-01-12: qty 5

## 2016-01-12 MED ORDER — SODIUM CHLORIDE 0.9 % IV SOLN
Freq: Once | INTRAVENOUS | Status: AC
  Administered 2016-01-12: 11:00:00 via INTRAVENOUS

## 2016-01-12 MED ORDER — VINBLASTINE SULFATE CHEMO INJECTION 1 MG/ML
5.9500 mg/m2 | Freq: Once | INTRAVENOUS | Status: AC
  Administered 2016-01-12: 11 mg via INTRAVENOUS
  Filled 2016-01-12: qty 11

## 2016-01-12 MED ORDER — SODIUM CHLORIDE 0.9 % IV SOLN
375.0000 mg/m2 | Freq: Once | INTRAVENOUS | Status: AC
  Administered 2016-01-12: 700 mg via INTRAVENOUS
  Filled 2016-01-12: qty 35

## 2016-01-12 MED ORDER — HEPARIN SOD (PORK) LOCK FLUSH 100 UNIT/ML IV SOLN
500.0000 [IU] | Freq: Once | INTRAVENOUS | Status: AC | PRN
  Administered 2016-01-12: 500 [IU]
  Filled 2016-01-12: qty 5

## 2016-01-12 MED ORDER — PALONOSETRON HCL INJECTION 0.25 MG/5ML
INTRAVENOUS | Status: AC
  Filled 2016-01-12: qty 5

## 2016-01-12 MED ORDER — SODIUM CHLORIDE 0.9% FLUSH
10.0000 mL | INTRAVENOUS | Status: DC | PRN
  Administered 2016-01-12: 10 mL
  Filled 2016-01-12: qty 10

## 2016-01-12 MED ORDER — DOXORUBICIN HCL CHEMO IV INJECTION 2 MG/ML
25.0000 mg/m2 | Freq: Once | INTRAVENOUS | Status: AC
  Administered 2016-01-12: 46 mg via INTRAVENOUS
  Filled 2016-01-12: qty 23

## 2016-01-12 MED ORDER — PALONOSETRON HCL INJECTION 0.25 MG/5ML
0.2500 mg | Freq: Once | INTRAVENOUS | Status: AC
  Administered 2016-01-12: 0.25 mg via INTRAVENOUS

## 2016-01-12 NOTE — Assessment & Plan Note (Signed)
Patient wanted his previously diagnosed rash checked quickly today.  Patient states that he was initially advised that the rash to his left upper back was probably shingles; but most recent note per Dr. Alvy Bimler states that the rash is most likely related to the dexamethasone use.  However, patient's left upper back rash appears scabbed and resolving well.  There is no evidence of infection.  Also, patient has a faint rash to his bilateral cheeks; which appear stable as well.  Patient was advised to call/return or go directly to the emergency department for any worsening symptoms whatsoever.

## 2016-01-12 NOTE — Progress Notes (Signed)
SYMPTOM MANAGEMENT CLINIC    Chief Complaint: Rash  HPI:  Ross Hansen 30 y.o. male diagnosed with Hodgkin's lymphoma.  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      07/28/2015 Pathology Results    Accession: [id number] core needle biopsy of the right supraclavicular region came back positive for classical Hodgkin lymphoma      08/22/2015 Bone Marrow Biopsy    Accession: [id number] Bone marrow is negative for involvement      08/22/2015 Procedure    He has port placement and bone marrow biopsy      08/23/2015 Imaging    ECHO showed normal EF      08/24/2015 PET scan    Bilateral hypermetabolic supraclavicular lymphadenopathy and mediastinal adenopathy consistent with known Hodgkin's lymphoma. 2. No disease is identified below the diaphragms.      08/25/2015 -  Chemotherapy    He is started on ABVD      09/19/2015 Imaging    Superficial thrombosis is noted in the left greater saphenous vein, from distal thigh extending up to groin. Non-occlusive DVT is extending into the left common femoral vein, at the   saphenofemoral junction.      09/22/2015 Adverse Reaction    Due to cough, Bleomycin is placed on hold      10/14/2015 PET scan    Marked reduction in size of the the thoracic and supraclavicular adenopathy, with interval resolution of the prior hypermetabolic activity indicating response to effective therapy. No significant residual hypermetabolic adenopathy.       Review of Systems  Skin: Positive for rash.  All  other systems reviewed and are negative.   Past Medical History:  Diagnosis Date  . Allergy   . Cancer [facility])    hodgkin lymphoma  . Familial multiple factor deficiency syndrome, type IV (HCC)    "Factor IV Leiden"    Past Surgical History:  Procedure Laterality Date  . TONSILLECTOMY    . 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    has No Known Allergies.    Medication List       Accurate as of 01/12/16  5:59 PM. Always use your most recent med list.          dexamethasone 4 MG tablet Commonly known as:  DECADRON Take 4-8 mg by mouth See admin instructions. Pt is to take two tablets once daily on the day after chemo and then two tablets two times daily for two days.   ibuprofen 200 MG tablet Commonly known as:  ADVIL,MOTRIN Take 400-600 mg by mouth every 6 (six) hours as needed for headache.   lidocaine-prilocaine cream Commonly known as:  EMLA Apply 1 application topically as needed (prior to accessing port).   LORazepam 0.5  MG tablet Commonly known as:  ATIVAN Take 1 tablet (0.5 mg total) by mouth 2 (two) times daily as needed for anxiety.   ondansetron 8 MG tablet Commonly known as:  ZOFRAN Take 8 mg by mouth 2 (two) times daily as needed for nausea or vomiting.   prochlorperazine 10 MG tablet Commonly known as:  COMPAZINE Take 10 mg by mouth every 6 (six) hours as needed for nausea or vomiting.   rivaroxaban 20 MG Tabs tablet Commonly known as:  XARELTO Take 1 tablet (20 mg total) by mouth daily with supper.        PHYSICAL EXAMINATION  Oncology Vitals 01/12/2016 01/12/2016  Height - 183 cm   Weight - 68.448 kg  Weight (lbs) - 150 lbs 14 oz  BMI (kg/m2) - 20.47 kg/m2  Temp 97.8 98.7  Pulse 74 75  Resp 18 18  SpO2 100 99  BSA (m2) - 1.86 m2   BP Readings from Last 2 Encounters:  01/12/16 127/69  01/12/16 120/69    Physical Exam  Constitutional: He is oriented to person, place, and time and well-developed, well-nourished, and in no distress.  HENT:  Head: Normocephalic and atraumatic.  Eyes: Conjunctivae and EOM are normal. Pupils are equal, round, and reactive to light.  Neck: Normal range of motion.  Pulmonary/Chest: Effort normal. No respiratory distress.  Musculoskeletal: Normal range of motion.  Neurological: He is alert and oriented to person, place, and time. Gait normal.  Skin: Skin is warm and dry. Rash noted.  Left upper back rash appears scabbed and resolving.  Also has trace rash to bilateral cheeks.  Psychiatric: Affect normal.  Nursing note and vitals reviewed.   LABORATORY DATA:. Appointment on 01/12/2016  Component Date Value Ref Range Status  . WBC 01/12/2016 2.3* 4.0 - 10.3 10e3/uL Final  . NEUT# 01/12/2016 0.9* 1.5 - 6.5 10e3/uL Final  . HGB 01/12/2016 13.4  13.0 - 17.1 g/dL Final  . HCT 01/12/2016 38.7  38.4 - 49.9 % Final  . Platelets 01/12/2016 193  140 - 400 10e3/uL Final  . MCV 01/12/2016 90.6  79.3 - 98.0 fL Final  . MCH 01/12/2016 31.4  27.2 - 33.4 pg Final  . MCHC 01/12/2016 34.6  32.0 - 36.0 g/dL Final  . RBC 01/12/2016 4.27  4.20 - 5.82 10e6/uL Final  . RDW 01/12/2016 14.6  11.0 - 14.6 % Final  . lymph# 01/12/2016 0.8* 0.9 - 3.3 10e3/uL Final  . MONO# 01/12/2016 0.5  0.1 - 0.9 10e3/uL Final  . Eosinophils Absolute 01/12/2016 0.1  0.0 - 0.5 10e3/uL Final  . Basophils Absolute 01/12/2016 0.0  0.0 - 0.1 10e3/uL Final  . NEUT% 01/12/2016 37.3* 39.0 - 75.0 % Final  . LYMPH% 01/12/2016 35.9  14.0 - 49.0 % Final  . MONO% 01/12/2016 20.3* 0.0 - 14.0 % Final  . EOS% 01/12/2016 5.2  0.0 - 7.0 % Final  . BASO% 01/12/2016 1.3  0.0 - 2.0 %  Final  . nRBC 01/12/2016 0  0 - 0 % Final  . Sodium 01/12/2016 141  136 - 145 mEq/L Final  . Potassium 01/12/2016 3.6  3.5 - 5.1 mEq/L Final  . Chloride 01/12/2016 107  98 - 109 mEq/L Final  . CO2 01/12/2016 24  22 - 29 mEq/L Final  . Glucose 01/12/2016 138  70 - 140 mg/dl Final  . BUN 01/12/2016 <4.0* 7.0 - 26.0 mg/dL Final  . Creatinine 01/12/2016 0.8  0.7 - 1.3 mg/dL Final  . Total Bilirubin 01/12/2016 0.28  0.20 - 1.20 mg/dL Final  . Alkaline Phosphatase 01/12/2016 55  40 - 150 U/L Final  . AST 01/12/2016 18  5 - 34 U/L Final  . ALT 01/12/2016 18  0 - 55 U/L Final  . Total Protein 01/12/2016 6.5  6.4 - 8.3 g/dL Final  . Albumin 01/12/2016 3.8  3.5 - 5.0 g/dL Final  . Calcium 01/12/2016 9.1  8.4 - 10.4 mg/dL Final  . Anion Gap 01/12/2016 10  3 - 11 mEq/L Final  . EGFR 01/12/2016 >90  >90 ml/min/1.73 m2 Final    RADIOGRAPHIC STUDIES: No results found.  ASSESSMENT/PLAN:    Rash Patient wanted his previously diagnosed rash checked quickly today.  Patient states that he was initially advised that the rash to his left upper back was probably shingles; but most recent note per Dr. Alvy Bimler states that the rash is most likely related to the dexamethasone use.  However, patient's left upper back rash appears scabbed and resolving well.  There is no evidence of infection.  Also, patient has a faint rash to his bilateral cheeks; which appear stable as well.  Patient was advised to call/return or go directly to the emergency department for any worsening symptoms whatsoever.  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  Patient is scheduled to return on 01/26/2016 for labs, flush, visit, and his next cycle of chemotherapy.   Patient stated understanding of all instructions; and was in agreement with this plan of care. The patient knows to call  the clinic with any problems, questions or concerns.   Total time spent with patient was 10 minutes;  with greater than 75 percent of that time spent in face to face counseling regarding patient's symptoms,  and coordination of care and follow up.  Disclaimer:This dictation was prepared with Dragon/digital dictation along with [employer]. Any transcriptional errors that result from this process are unintentional.  Drue Second, NP 01/12/2016

## 2016-01-12 NOTE — Progress Notes (Signed)
Adriamycin administered over 10 minutes through a free dripping normal saline line. Positive blood return noted before, every 5 mL during, and after Adriamycin Administration. Patient tolerated well.

## 2016-01-12 NOTE — Patient Instructions (Signed)

## 2016-01-12 NOTE — Patient Instructions (Signed)
[place] Discharge Instructions for Patients Receiving Chemotherapy  Today you received the following chemotherapy agents Adriamycin/Velban/DTIC To help prevent nausea and vomiting after your treatment, we encourage you to take your nausea medication as prescribed.   If you develop nausea and vomiting that is not controlled by your nausea medication, call the clinic.   BELOW ARE SYMPTOMS THAT SHOULD BE REPORTED IMMEDIATELY:  *FEVER GREATER THAN 100.5 F  *CHILLS WITH OR WITHOUT FEVER  NAUSEA AND VOMITING THAT IS NOT CONTROLLED WITH YOUR NAUSEA MEDICATION  *UNUSUAL SHORTNESS OF BREATH  *UNUSUAL BRUISING OR BLEEDING  TENDERNESS IN MOUTH AND THROAT WITH OR WITHOUT PRESENCE OF ULCERS  *URINARY PROBLEMS  *BOWEL PROBLEMS  UNUSUAL RASH Items with * indicate a potential emergency and should be followed up as soon as possible.  Feel free to call the clinic you have any questions or concerns. The clinic phone number is [phone].  Please show the [facility] at check-in to the Emergency Department and triage nurse.

## 2016-01-12 NOTE — Assessment & Plan Note (Signed)
Patient presented to the [facility] today to receive cycle 5, day 15 of his ABVD  chemotherapy regimen.  Patient is scheduled to return on 01/26/2016 for labs, flush, visit, and his next cycle of chemotherapy.

## 2016-01-12 NOTE — Progress Notes (Signed)
Pt informed this nurse that he has had 'shingles' on his back and would like [employer]. NP to look at it and make sure it is ok for chemo today.  Area is just below the base of his neck in the center and he also has a few raised areas on either side of his nose. The area on hisback/neck are dry and crusty. No weeping lesions. Selena Lesser, NP in to assess areas. OK for treatment today as far as skin rash/shingles is concerned.

## 2016-01-13 ENCOUNTER — Ambulatory Visit: Payer: No Typology Code available for payment source

## 2016-01-26 ENCOUNTER — Ambulatory Visit: Payer: No Typology Code available for payment source

## 2016-01-26 ENCOUNTER — Ambulatory Visit (HOSPITAL_BASED_OUTPATIENT_CLINIC_OR_DEPARTMENT_OTHER): Payer: No Typology Code available for payment source | Admitting: Hematology and Oncology

## 2016-01-26 ENCOUNTER — Ambulatory Visit (HOSPITAL_BASED_OUTPATIENT_CLINIC_OR_DEPARTMENT_OTHER): Payer: No Typology Code available for payment source

## 2016-01-26 ENCOUNTER — Encounter: Payer: Self-pay | Admitting: Hematology and Oncology

## 2016-01-26 ENCOUNTER — Other Ambulatory Visit (HOSPITAL_BASED_OUTPATIENT_CLINIC_OR_DEPARTMENT_OTHER): Payer: No Typology Code available for payment source

## 2016-01-26 VITALS — BP 121/75 | HR 76 | Temp 98.4°F | Resp 18 | Ht 72.0 in | Wt 150.9 lb

## 2016-01-26 DIAGNOSIS — D702 Other drug-induced agranulocytosis: Secondary | ICD-10-CM

## 2016-01-26 DIAGNOSIS — C8118 Nodular sclerosis classical Hodgkin lymphoma, lymph nodes of multiple sites: Secondary | ICD-10-CM

## 2016-01-26 DIAGNOSIS — R21 Rash and other nonspecific skin eruption: Secondary | ICD-10-CM | POA: Diagnosis not present

## 2016-01-26 DIAGNOSIS — Z5111 Encounter for antineoplastic chemotherapy: Secondary | ICD-10-CM | POA: Diagnosis not present

## 2016-01-26 DIAGNOSIS — Z720 Tobacco use: Secondary | ICD-10-CM

## 2016-01-26 DIAGNOSIS — I82412 Acute embolism and thrombosis of left femoral vein: Secondary | ICD-10-CM

## 2016-01-26 DIAGNOSIS — Z95828 Presence of other vascular implants and grafts: Secondary | ICD-10-CM

## 2016-01-26 LAB — COMPREHENSIVE METABOLIC PANEL
ALT: 19 U/L (ref 0–55)
ANION GAP: 9 meq/L (ref 3–11)
AST: 16 U/L (ref 5–34)
Albumin: 3.6 g/dL (ref 3.5–5.0)
Alkaline Phosphatase: 57 U/L (ref 40–150)
BUN: 5.4 mg/dL — ABNORMAL LOW (ref 7.0–26.0)
CHLORIDE: 109 meq/L (ref 98–109)
CO2: 24 meq/L (ref 22–29)
CREATININE: 0.8 mg/dL (ref 0.7–1.3)
Calcium: 8.9 mg/dL (ref 8.4–10.4)
Glucose: 81 mg/dl (ref 70–140)
Potassium: 3.6 mEq/L (ref 3.5–5.1)
Sodium: 142 mEq/L (ref 136–145)
Total Bilirubin: <0.22 mg/dL (ref 0.20–1.20)
Total Protein: 6.4 g/dL (ref 6.4–8.3)

## 2016-01-26 LAB — CBC WITH DIFFERENTIAL/PLATELET
BASO%: 1.9 % (ref 0.0–2.0)
Basophils Absolute: 0.1 10*3/uL (ref 0.0–0.1)
EOS%: 7.1 % — ABNORMAL HIGH (ref 0.0–7.0)
Eosinophils Absolute: 0.2 10*3/uL (ref 0.0–0.5)
HCT: 38.6 % (ref 38.4–49.9)
HGB: 13.3 g/dL (ref 13.0–17.1)
LYMPH%: 30.5 % (ref 14.0–49.0)
MCH: 31.5 pg (ref 27.2–33.4)
MCHC: 34.5 g/dL (ref 32.0–36.0)
MCV: 91.5 fL (ref 79.3–98.0)
MONO#: 0.7 10*3/uL (ref 0.1–0.9)
MONO%: 24.4 % — ABNORMAL HIGH (ref 0.0–14.0)
NEUT#: 1 10*3/uL — ABNORMAL LOW (ref 1.5–6.5)
NEUT%: 36.1 % — AB (ref 39.0–75.0)
PLATELETS: 178 10*3/uL (ref 140–400)
RBC: 4.22 10*6/uL (ref 4.20–5.82)
RDW: 14.6 % (ref 11.0–14.6)
WBC: 2.7 10*3/uL — ABNORMAL LOW (ref 4.0–10.3)
lymph#: 0.8 10*3/uL — ABNORMAL LOW (ref 0.9–3.3)

## 2016-01-26 MED ORDER — DOXORUBICIN HCL CHEMO IV INJECTION 2 MG/ML
25.0000 mg/m2 | Freq: Once | INTRAVENOUS | Status: AC
  Administered 2016-01-26: 46 mg via INTRAVENOUS
  Filled 2016-01-26: qty 23

## 2016-01-26 MED ORDER — HEPARIN SOD (PORK) LOCK FLUSH 100 UNIT/ML IV SOLN
500.0000 [IU] | Freq: Once | INTRAVENOUS | Status: AC | PRN
  Administered 2016-01-26: 500 [IU]
  Filled 2016-01-26: qty 5

## 2016-01-26 MED ORDER — SODIUM CHLORIDE 0.9 % IV SOLN
Freq: Once | INTRAVENOUS | Status: AC
  Administered 2016-01-26: 09:00:00 via INTRAVENOUS
  Filled 2016-01-26: qty 5

## 2016-01-26 MED ORDER — PALONOSETRON HCL INJECTION 0.25 MG/5ML
0.2500 mg | Freq: Once | INTRAVENOUS | Status: AC
  Administered 2016-01-26: 0.25 mg via INTRAVENOUS

## 2016-01-26 MED ORDER — SODIUM CHLORIDE 0.9 % IJ SOLN
10.0000 mL | INTRAMUSCULAR | Status: DC | PRN
  Administered 2016-01-26: 10 mL via INTRAVENOUS
  Filled 2016-01-26: qty 10

## 2016-01-26 MED ORDER — SODIUM CHLORIDE 0.9 % IV SOLN
375.0000 mg/m2 | Freq: Once | INTRAVENOUS | Status: AC
  Administered 2016-01-26: 700 mg via INTRAVENOUS
  Filled 2016-01-26: qty 35

## 2016-01-26 MED ORDER — PALONOSETRON HCL INJECTION 0.25 MG/5ML
INTRAVENOUS | Status: AC
  Filled 2016-01-26: qty 5

## 2016-01-26 MED ORDER — SODIUM CHLORIDE 0.9 % IV SOLN
Freq: Once | INTRAVENOUS | Status: AC
  Administered 2016-01-26: 09:00:00 via INTRAVENOUS

## 2016-01-26 MED ORDER — VINBLASTINE SULFATE CHEMO INJECTION 1 MG/ML
5.9500 mg/m2 | Freq: Once | INTRAVENOUS | Status: AC
  Administered 2016-01-26: 11 mg via INTRAVENOUS
  Filled 2016-01-26: qty 11

## 2016-01-26 MED ORDER — SODIUM CHLORIDE 0.9% FLUSH
10.0000 mL | INTRAVENOUS | Status: DC | PRN
  Administered 2016-01-26: 10 mL
  Filled 2016-01-26: qty 10

## 2016-01-26 NOTE — Progress Notes (Signed)
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      07/28/2015 Pathology Results    Accession: [id number] core needle biopsy of the right supraclavicular region came back positive for classical Hodgkin lymphoma      08/22/2015 Bone Marrow Biopsy    Accession: [id number] Bone marrow is negative for involvement      08/22/2015 Procedure    He has port placement and bone marrow biopsy      08/23/2015 Imaging    ECHO showed normal EF      08/24/2015 PET scan    Bilateral hypermetabolic supraclavicular lymphadenopathy and mediastinal adenopathy consistent with known Hodgkin's lymphoma. 2. No disease is identified below the diaphragms.      08/25/2015 -  Chemotherapy    He is started on ABVD      09/19/2015 Imaging    Superficial thrombosis is noted in the left greater saphenous vein, from distal thigh extending up to groin. Non-occlusive DVT is extending into the left common femoral vein, at the   saphenofemoral junction.      09/22/2015 Adverse Reaction    Due to cough, Bleomycin is placed on hold      10/14/2015 PET scan    Marked reduction in size of the the thoracic and supraclavicular adenopathy, with interval resolution of the prior hypermetabolic activity indicating response to effective therapy. No significant residual hypermetabolic adenopathy.       INTERVAL HISTORY: Please see below for problem oriented  charting. He is seen prior to cycle 6 of treatment. He feels well. Apart from skin rash on his back, he has no further concerns. No recent infection or new lymphadenopathy. He is compliant taking Xarelto. The patient denies any recent signs or symptoms of bleeding such as spontaneous epistaxis, hematuria or hematochezia. He denies peripheral neuropathy  REVIEW OF SYSTEMS:   Constitutional: Denies fevers, chills or abnormal weight loss Eyes: Denies blurriness of vision Ears, nose, mouth, throat, and face: Denies mucositis or sore throat Respiratory: Denies cough, dyspnea or wheezes Cardiovascular: Denies palpitation, chest discomfort or lower extremity swelling Gastrointestinal:  Denies nausea, heartburn or change in bowel habits Skin: Denies abnormal skin rashes Lymphatics: Denies new lymphadenopathy or easy bruising Neurological:Denies numbness, tingling or new weaknesses Behavioral/Psych: Mood is stable, no new changes  All other systems were reviewed with the patient and are negative.  I have reviewed the past medical history, past surgical history, social history and family history with the patient and they are unchanged from previous note.  ALLERGIES:  has No Known Allergies.  MEDICATIONS:  Current Outpatient Prescriptions  Medication Sig Dispense Refill  . dexamethasone (DECADRON) 4 MG tablet Take 4-8 mg by mouth See admin instructions. Pt is to take two tablets once daily on the day after chemo and then two tablets two times daily for two days.    Marland Kitchen ibuprofen (ADVIL,MOTRIN) 200 MG tablet Take 400-600 mg by mouth every 6 (six) hours as needed for headache.    Marland Kitchen  lidocaine-prilocaine (EMLA) cream Apply 1 application topically as needed (prior to accessing port).    . LORazepam (ATIVAN) 0.5 MG tablet Take 1 tablet (0.5 mg total) by mouth 2 (two) times daily as needed for anxiety. 30 tablet 0  . ondansetron (ZOFRAN) 8 MG tablet Take 8 mg by mouth 2 (two) times daily as needed for  nausea or vomiting.    . prochlorperazine (COMPAZINE) 10 MG tablet Take 10 mg by mouth every 6 (six) hours as needed for nausea or vomiting.    . rivaroxaban (XARELTO) 20 MG TABS tablet Take 1 tablet (20 mg total) by mouth daily with supper. 30 tablet 9   No current facility-administered medications for this visit.    Facility-Administered Medications Ordered in Other Visits  Medication Dose Route Frequency Provider Last Rate Last Dose  . dacarbazine (DTIC) 700 mg in sodium chloride 0.9 % 250 mL chemo infusion  375 mg/m2 (Treatment Plan Recorded) Intravenous Once Heath Lark, MD      . DOXOrubicin (ADRIAMYCIN) chemo injection 46 mg  25 mg/m2 (Treatment Plan Recorded) Intravenous Once Heath Lark, MD      . heparin lock flush 100 unit/mL  500 Units Intracatheter Once PRN Heath Lark, MD      . sodium chloride flush (NS) 0.9 % injection 10 mL  10 mL Intracatheter PRN Heath Lark, MD      . vinBLAStine (VELBAN) 11 mg in sodium chloride 0.9 % 50 mL chemo infusion  5.95 mg/m2 (Treatment Plan Recorded) Intravenous Once Heath Lark, MD        PHYSICAL EXAMINATION: ECOG PERFORMANCE STATUS: 1 - Symptomatic but completely ambulatory  Vitals:   01/26/16 0836  BP: 121/75  Pulse: 76  Resp: 18  Temp: 98.4 F (36.9 C)   Filed Weights   01/26/16 0836  Weight: 150 lb 14.4 oz (68.4 kg)    GENERAL:alert, no distress and comfortable SKIN: Noted dermatitis on the back of his neck. EYES: normal, Conjunctiva are pink and non-injected, sclera clear OROPHARYNX:no exudate, no erythema and lips, buccal mucosa, and tongue normal  NECK: supple, thyroid normal size, non-tender, without nodularity LYMPH:  no palpable lymphadenopathy in the cervical, axillary or inguinal LUNGS: clear to auscultation and percussion with normal breathing effort HEART: regular rate & rhythm and no murmurs and no lower extremity edema ABDOMEN:abdomen soft, non-tender and normal bowel sounds Musculoskeletal:no cyanosis of digits and  no clubbing  NEURO: alert & oriented x 3 with fluent speech, no focal motor/sensory deficits  LABORATORY DATA:  I have reviewed the data as listed    Component Value Date/Time   NA 142 01/26/2016 0807   K 3.6 01/26/2016 0807   CL 102 09/29/2015 0218   CO2 24 01/26/2016 0807   GLUCOSE 81 01/26/2016 0807   BUN 5.4 (L) 01/26/2016 0807   CREATININE 0.8 01/26/2016 0807   CALCIUM 8.9 01/26/2016 0807   PROT 6.4 01/26/2016 0807   ALBUMIN 3.6 01/26/2016 0807   AST 16 01/26/2016 0807   ALT 19 01/26/2016 0807   ALKPHOS 57 01/26/2016 0807   BILITOT <0.22 01/26/2016 0807   GFRNONAA >60 09/29/2015 0218   GFRAA >60 09/29/2015 0218    No results found for: SPEP, UPEP  Lab Results  Component Value Date   WBC 2.7 (L) 01/26/2016   NEUTROABS 1.0 (L) 01/26/2016   HGB 13.3 01/26/2016   HCT 38.6 01/26/2016   MCV 91.5 01/26/2016   PLT 178 01/26/2016      Chemistry      Component  Value Date/Time   NA 142 01/26/2016 0807   K 3.6 01/26/2016 0807   CL 102 09/29/2015 0218   CO2 24 01/26/2016 0807   BUN 5.4 (L) 01/26/2016 0807   CREATININE 0.8 01/26/2016 0807      Component Value Date/Time   CALCIUM 8.9 01/26/2016 0807   ALKPHOS 57 01/26/2016 0807   AST 16 01/26/2016 0807   ALT 19 01/26/2016 0807   BILITOT <0.22 01/26/2016 0807      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 We'll proceed with cycle 6 of treatment without dose adjustment. I reviewed the guidelines with him. He will finish cycle 6 of treatment in 2 weeks. After that, I plan to see him back in 3 months we'll repeat blood work, imaging study and examination  Drug-induced neutropenia ([place]) This is likely due to recent treatment. The patient denies recent history of fevers, cough, chills, diarrhea or dysuria. He is asymptomatic from the leukopenia. I will observe for now.  I will continue the chemotherapy at current  dose without dosage adjustment.  If the leukopenia gets progressive worse in the future, I might have to delay his treatment or adjust the chemotherapy dose.  Rash He has persistent rash which resemble dermatitis. His skin is very dry. I recommend topical emollient cream for now  Acute deep vein thrombosis (DVT) of left femoral vein ([place]) The patient has provoked DVT. I recommend he complete 6 months of anticoagulation therapy until the end of January 2018. After that, we will get the port removed In the future, he will take 325 mg aspirin indefinitely  Tobacco abuse I spent some time counseling the patient the importance of tobacco cessation. he is currently attempting to quit on his own    Orders Placed This Encounter  Procedures  . IR Removal Tun Access W/ Port W/O FL    Standing Status:   Future    Standing Expiration Date:   03/27/2017    Order Specific Question:   Reason for exam:    Answer:   remove port on 03/28/16. Patient will stop xarelto on 02/2416    Order Specific Question:   Preferred Imaging Location?    Answer:   [facility] Skull Base To Thigh    Standing Status:   Future    Standing Expiration Date:   03/01/2017    Order Specific Question:   Reason for exam:    Answer:   staging lymphoma, assess response to Rx    Order Specific Question:   Preferred imaging location?    Answer:   [facility]   All questions were answered. The patient knows to call the clinic with any problems, questions or concerns. No barriers to learning was detected. I spent 20 minutes counseling the patient face to face. The total time spent in the appointment was 25 minutes and more than 50% was on counseling and review of test results     Heath Lark, MD 01/26/2016 9:53 AM

## 2016-01-26 NOTE — Assessment & Plan Note (Signed)
The patient has provoked DVT. I recommend he complete 6 months of anticoagulation therapy until the end of January 2018. After that, we will get the port removed In the future, he will take 325 mg aspirin indefinitely

## 2016-01-26 NOTE — Assessment & Plan Note (Signed)
He has persistent rash which resemble dermatitis. His skin is very dry. I recommend topical emollient cream for now

## 2016-01-26 NOTE — Assessment & Plan Note (Signed)
I spent some time counseling the patient the importance of tobacco cessation. he is currently attempting to quit on his own 

## 2016-01-26 NOTE — Progress Notes (Signed)
Per treatment conditions okay to treat with ANC. 1004: blood return noted before, every 3cc and after Adriamycin push. Pt tolerated well.  1020: blood return noted before and after Velban.

## 2016-01-26 NOTE — Assessment & Plan Note (Signed)
This is likely due to recent treatment. The patient denies recent history of fevers, cough, chills, diarrhea or dysuria. He is asymptomatic from the leukopenia. I will observe for now.  I will continue the chemotherapy at current dose without dosage adjustment.  If the leukopenia gets progressive worse in the future, I might have to delay his treatment or adjust the chemotherapy dose. 

## 2016-01-26 NOTE — Patient Instructions (Signed)
[facility] Discharge Instructions for Patients Receiving Chemotherapy  Today you received the following chemotherapy agents: Adriamycin, Vinblastin, and DTIC  To help prevent nausea and vomiting after your treatment, we encourage you to take your nausea medication as directed.    If you develop nausea and vomiting that is not controlled by your nausea medication, call the clinic.   BELOW ARE SYMPTOMS THAT SHOULD BE REPORTED IMMEDIATELY:  *FEVER GREATER THAN 100.5 F  *CHILLS WITH OR WITHOUT FEVER  NAUSEA AND VOMITING THAT IS NOT CONTROLLED WITH YOUR NAUSEA MEDICATION  *UNUSUAL SHORTNESS OF BREATH  *UNUSUAL BRUISING OR BLEEDING  TENDERNESS IN MOUTH AND THROAT WITH OR WITHOUT PRESENCE OF ULCERS  *URINARY PROBLEMS  *BOWEL PROBLEMS  UNUSUAL RASH Items with * indicate a potential emergency and should be followed up as soon as possible.  Feel free to call the clinic you have any questions or concerns. The clinic phone number is [phone].  Please show the [facility] [facility] at check-in to the Emergency Department and triage nurse.

## 2016-01-26 NOTE — Assessment & Plan Note (Signed)
He tolerated last cycle of treatment well. We'll proceed with cycle 6 of treatment without dose adjustment. I reviewed the guidelines with him. He will finish cycle 6 of treatment in 2 weeks. After that, I plan to see him back in 3 months we'll repeat blood work, imaging study and examination

## 2016-02-06 ENCOUNTER — Telehealth: Payer: Self-pay | Admitting: General Practice

## 2016-02-06 NOTE — Telephone Encounter (Signed)
Left msg regarding March 2018 appts.

## 2016-02-08 ENCOUNTER — Ambulatory Visit (HOSPITAL_BASED_OUTPATIENT_CLINIC_OR_DEPARTMENT_OTHER): Payer: No Typology Code available for payment source

## 2016-02-08 ENCOUNTER — Other Ambulatory Visit (HOSPITAL_BASED_OUTPATIENT_CLINIC_OR_DEPARTMENT_OTHER): Payer: No Typology Code available for payment source

## 2016-02-08 VITALS — BP 119/84 | HR 77 | Temp 99.1°F | Resp 18

## 2016-02-08 DIAGNOSIS — C8118 Nodular sclerosis classical Hodgkin lymphoma, lymph nodes of multiple sites: Secondary | ICD-10-CM

## 2016-02-08 DIAGNOSIS — Z5111 Encounter for antineoplastic chemotherapy: Secondary | ICD-10-CM | POA: Diagnosis not present

## 2016-02-08 LAB — COMPREHENSIVE METABOLIC PANEL
ALT: 21 U/L (ref 0–55)
AST: 19 U/L (ref 5–34)
Albumin: 3.8 g/dL (ref 3.5–5.0)
Alkaline Phosphatase: 59 U/L (ref 40–150)
Anion Gap: 9 mEq/L (ref 3–11)
BILIRUBIN TOTAL: 0.3 mg/dL (ref 0.20–1.20)
BUN: 4.8 mg/dL — ABNORMAL LOW (ref 7.0–26.0)
CHLORIDE: 108 meq/L (ref 98–109)
CO2: 24 meq/L (ref 22–29)
Calcium: 8.9 mg/dL (ref 8.4–10.4)
Creatinine: 0.8 mg/dL (ref 0.7–1.3)
GLUCOSE: 92 mg/dL (ref 70–140)
Potassium: 3.9 mEq/L (ref 3.5–5.1)
SODIUM: 141 meq/L (ref 136–145)
TOTAL PROTEIN: 6.6 g/dL (ref 6.4–8.3)

## 2016-02-08 LAB — CBC WITH DIFFERENTIAL/PLATELET
BASO%: 2.5 % — AB (ref 0.0–2.0)
Basophils Absolute: 0.1 10*3/uL (ref 0.0–0.1)
EOS%: 7.5 % — ABNORMAL HIGH (ref 0.0–7.0)
Eosinophils Absolute: 0.2 10*3/uL (ref 0.0–0.5)
HCT: 40.5 % (ref 38.4–49.9)
HGB: 13.5 g/dL (ref 13.0–17.1)
LYMPH%: 35.3 % (ref 14.0–49.0)
MCH: 31 pg (ref 27.2–33.4)
MCHC: 33.3 g/dL (ref 32.0–36.0)
MCV: 93 fL (ref 79.3–98.0)
MONO#: 0.5 10*3/uL (ref 0.1–0.9)
MONO%: 22.3 % — AB (ref 0.0–14.0)
NEUT%: 32.4 % — ABNORMAL LOW (ref 39.0–75.0)
NEUTROS ABS: 0.7 10*3/uL — AB (ref 1.5–6.5)
Platelets: 213 10*3/uL (ref 140–400)
RBC: 4.35 10*6/uL (ref 4.20–5.82)
RDW: 15.2 % — ABNORMAL HIGH (ref 11.0–14.6)
WBC: 2.2 10*3/uL — AB (ref 4.0–10.3)
lymph#: 0.8 10*3/uL — ABNORMAL LOW (ref 0.9–3.3)

## 2016-02-08 MED ORDER — FOSAPREPITANT DIMEGLUMINE INJECTION 150 MG
Freq: Once | INTRAVENOUS | Status: AC
  Administered 2016-02-08: 09:00:00 via INTRAVENOUS
  Filled 2016-02-08: qty 5

## 2016-02-08 MED ORDER — PALONOSETRON HCL INJECTION 0.25 MG/5ML
INTRAVENOUS | Status: AC
  Filled 2016-02-08: qty 5

## 2016-02-08 MED ORDER — HEPARIN SOD (PORK) LOCK FLUSH 100 UNIT/ML IV SOLN
500.0000 [IU] | Freq: Once | INTRAVENOUS | Status: AC | PRN
  Administered 2016-02-08: 500 [IU]
  Filled 2016-02-08: qty 5

## 2016-02-08 MED ORDER — SODIUM CHLORIDE 0.9% FLUSH
10.0000 mL | INTRAVENOUS | Status: DC | PRN
  Administered 2016-02-08: 10 mL
  Filled 2016-02-08: qty 10

## 2016-02-08 MED ORDER — SODIUM CHLORIDE 0.9 % IV SOLN
375.0000 mg/m2 | Freq: Once | INTRAVENOUS | Status: AC
  Administered 2016-02-08: 700 mg via INTRAVENOUS
  Filled 2016-02-08: qty 35

## 2016-02-08 MED ORDER — PALONOSETRON HCL INJECTION 0.25 MG/5ML
0.2500 mg | Freq: Once | INTRAVENOUS | Status: AC
  Administered 2016-02-08: 0.25 mg via INTRAVENOUS

## 2016-02-08 MED ORDER — SODIUM CHLORIDE 0.9 % IV SOLN
Freq: Once | INTRAVENOUS | Status: AC
  Administered 2016-02-08: 09:00:00 via INTRAVENOUS

## 2016-02-08 MED ORDER — DOXORUBICIN HCL CHEMO IV INJECTION 2 MG/ML
25.0000 mg/m2 | Freq: Once | INTRAVENOUS | Status: AC
  Administered 2016-02-08: 46 mg via INTRAVENOUS
  Filled 2016-02-08: qty 23

## 2016-02-08 MED ORDER — VINBLASTINE SULFATE CHEMO INJECTION 1 MG/ML
5.9500 mg/m2 | Freq: Once | INTRAVENOUS | Status: AC
  Administered 2016-02-08: 11 mg via INTRAVENOUS
  Filled 2016-02-08: qty 11

## 2016-02-08 NOTE — Patient Instructions (Signed)
[facility] Discharge Instructions for Patients Receiving Chemotherapy  Today you received the following chemotherapy agents:  Adriamycin, Vinblastine, Dacarbazine  To help prevent nausea and vomiting after your treatment, we encourage you to take your nausea medication as prescribed.   If you develop nausea and vomiting that is not controlled by your nausea medication, call the clinic.   BELOW ARE SYMPTOMS THAT SHOULD BE REPORTED IMMEDIATELY:  *FEVER GREATER THAN 100.5 F  *CHILLS WITH OR WITHOUT FEVER  NAUSEA AND VOMITING THAT IS NOT CONTROLLED WITH YOUR NAUSEA MEDICATION  *UNUSUAL SHORTNESS OF BREATH  *UNUSUAL BRUISING OR BLEEDING  TENDERNESS IN MOUTH AND THROAT WITH OR WITHOUT PRESENCE OF ULCERS  *URINARY PROBLEMS  *BOWEL PROBLEMS  UNUSUAL RASH Items with * indicate a potential emergency and should be followed up as soon as possible.  Feel free to call the clinic you have any questions or concerns. The clinic phone number is [phone].  Please show the [place] at check-in to the Emergency Department and triage nurse.

## 2016-03-27 ENCOUNTER — Other Ambulatory Visit: Payer: Self-pay | Admitting: General Surgery

## 2016-03-28 ENCOUNTER — Ambulatory Visit (HOSPITAL_COMMUNITY)
Admission: RE | Admit: 2016-03-28 | Discharge: 2016-03-28 | Disposition: A | Discharge status: Home / Self Care | Payer: No Typology Code available for payment source | Source: Ambulatory Visit | Attending: Hematology and Oncology | Admitting: Hematology and Oncology

## 2016-03-28 ENCOUNTER — Encounter (HOSPITAL_COMMUNITY): Payer: Self-pay

## 2016-03-28 DIAGNOSIS — Z452 Encounter for adjustment and management of vascular access device: Secondary | ICD-10-CM | POA: Insufficient documentation

## 2016-03-28 DIAGNOSIS — Z8572 Personal history of non-Hodgkin lymphomas: Secondary | ICD-10-CM | POA: Insufficient documentation

## 2016-03-28 DIAGNOSIS — C8118 Nodular sclerosis classical Hodgkin lymphoma, lymph nodes of multiple sites: Secondary | ICD-10-CM

## 2016-03-28 HISTORY — PX: IR GENERIC HISTORICAL: IMG1180011

## 2016-03-28 LAB — CBC WITH DIFFERENTIAL/PLATELET
BASOS ABS: 0.1 10*3/uL (ref 0.0–0.1)
BASOS PCT: 2 %
Eosinophils Absolute: 0.9 10*3/uL — ABNORMAL HIGH (ref 0.0–0.7)
Eosinophils Relative: 15 %
HEMATOCRIT: 41.1 % (ref 39.0–52.0)
HEMOGLOBIN: 14.4 g/dL (ref 13.0–17.0)
Lymphocytes Relative: 18 %
Lymphs Abs: 1.1 10*3/uL (ref 0.7–4.0)
MCH: 31.7 pg (ref 26.0–34.0)
MCHC: 35 g/dL (ref 30.0–36.0)
MCV: 90.5 fL (ref 78.0–100.0)
Monocytes Absolute: 0.7 10*3/uL (ref 0.1–1.0)
Monocytes Relative: 12 %
NEUTROS ABS: 3.2 10*3/uL (ref 1.7–7.7)
NEUTROS PCT: 53 %
Platelets: 218 10*3/uL (ref 150–400)
RBC: 4.54 MIL/uL (ref 4.22–5.81)
RDW: 14.1 % (ref 11.5–15.5)
WBC: 5.9 10*3/uL (ref 4.0–10.5)

## 2016-03-28 LAB — BASIC METABOLIC PANEL
ANION GAP: 8 (ref 5–15)
BUN: 8 mg/dL (ref 6–20)
CALCIUM: 9.1 mg/dL (ref 8.9–10.3)
CO2: 26 mmol/L (ref 22–32)
Chloride: 106 mmol/L (ref 101–111)
Creatinine, Ser: 0.93 mg/dL (ref 0.61–1.24)
GFR calc non Af Amer: >60 mL/min (ref >60)
Glucose, Bld: 94 mg/dL (ref 65–99)
Potassium: 3.6 mmol/L (ref 3.5–5.1)
Sodium: 140 mmol/L (ref 135–145)

## 2016-03-28 LAB — PROTIME-INR
INR: 1.03
Prothrombin Time: 13.5 seconds (ref 11.4–15.2)

## 2016-03-28 MED ORDER — CEFAZOLIN SODIUM-DEXTROSE 2-4 GM/100ML-% IV SOLN
2.0000 g | INTRAVENOUS | Status: AC
  Administered 2016-03-28: 2 g via INTRAVENOUS
  Filled 2016-03-28: qty 100

## 2016-03-28 MED ORDER — SODIUM CHLORIDE 0.9 % IV SOLN
INTRAVENOUS | Status: DC
  Administered 2016-03-28: 10:00:00 via INTRAVENOUS

## 2016-03-28 MED ORDER — LIDOCAINE-EPINEPHRINE (PF) 2 %-1:200000 IJ SOLN
INTRAMUSCULAR | Status: AC
  Filled 2016-03-28: qty 20

## 2016-03-28 NOTE — Progress Notes (Signed)
Patient ID: Ross Hansen, male   DOB: 17-Aug-1985, 31 y.o.   MRN: [id number] Patient presents today for Port-A-Cath removal. He has a history of Hodgkin's lymphoma and has completed treatment. Details/risks of procedure, including but not limited to, internal bleeding, infection, injury to adjacent structures, discussed with patient with his understanding and consent. He declines IV conscious sedation for port removal at this time.

## 2016-03-28 NOTE — Discharge Instructions (Signed)
Sutured Wound Care Sutures are stitches that can be used to close wounds. Taking care of your wound properly can help prevent pain and infection. It can also help your wound to heal more quickly. How is this treated? Wound Care  Keep the wound clean and dry.  If you were given a bandage (dressing), change it at least one time per day or as told by your doctor. You should also change it if it gets wet or dirty.  Keep the wound completely dry for the first 24 hours or as told by your doctor. After that time, you may shower or bathe. However, make sure that the wound is not soaked in water until the sutures have been removed.  Clean the wound one time each day or as told by your doctor. ? Wash the wound with soap and water. ? Rinse the wound with water to remove all soap. ? Pat the wound dry with a clean towel. Do not rub the wound.  After cleaning the wound, put a thin layer of antibiotic ointment on it as told your doctor. This ointment: ? Helps to prevent infection. ? Keeps the bandage from sticking to the wound.  Have the sutures removed as told by your doctor. General Instructions  Take or apply medicines only as told by your doctor.  To help prevent scarring, make sure to cover your wound with sunscreen whenever you are outside after the sutures are removed and the wound is healed. Make sure to wear a sunscreen of at least 30 SPF.  If you were prescribed an antibiotic medicine or ointment, finish all of it even if you start to feel better.  Do not scratch or pick at the wound.  Keep all follow-up visits as told by your doctor. This is important.  Check your wound every day for signs of infection. Watch for: ? Redness, swelling, or pain. ? Fluid, blood, or pus.  Raise (elevate) the injured area above the level of your heart while you are sitting or lying down, if possible.  Avoid stretching your wound.  Drink enough fluids to keep your pee (urine) clear or pale  yellow. Contact a doctor if:  You were given a tetanus shot and you have any of these where the needle went in: ? Swelling. ? Very bad pain. ? Redness. ? Bleeding.  You have a fever.  A wound that was closed breaks open.  You notice a bad smell coming from the wound.  You notice something coming out of the wound, such as wood or glass.  Medicine does not help your pain.  You have any of these at the site of the wound. ? More redness. ? More swelling. ? More pain.  You have any of these coming from the wound. ? Fluid. ? Blood. ? Pus.  You notice a change in the color of your skin near the wound.  You need to change the bandage often due to fluid, blood, or pus coming from the wound.  You have a new rash.  You have numbness around the wound. Get help right away if:  You have very bad swelling around the wound.  Your pain suddenly gets worse and is very bad.  You have painful lumps near the wound or on skin that is anywhere on your body.  You have a red streak going away from the wound.  The wound is on your hand or foot and you cannot move a finger or toe like normal.  The   wound is on your hand or foot and you notice that your fingers or toes look pale or bluish. This information is not intended to replace advice given to you by your health care provider. Make sure you discuss any questions you have with your health care provider. Document Released: 08/01/2007 Document Revised: 07/21/2015 Document Reviewed: 09/24/2012 Elsevier Interactive Patient Education  2017 [employer]  

## 2016-03-28 NOTE — Procedures (Signed)
Successful removal of right anterior chest wall port-a-cath. EBL: Minimal No immediate post procedural complications.   Jay Kinzey Sheriff, MD Pager #: [phone]  

## 2016-05-09 ENCOUNTER — Other Ambulatory Visit (HOSPITAL_BASED_OUTPATIENT_CLINIC_OR_DEPARTMENT_OTHER): Payer: No Typology Code available for payment source

## 2016-05-09 ENCOUNTER — Ambulatory Visit (HOSPITAL_COMMUNITY): Payer: No Typology Code available for payment source

## 2016-05-09 ENCOUNTER — Telehealth: Payer: Self-pay | Admitting: Hematology

## 2016-05-09 ENCOUNTER — Other Ambulatory Visit: Payer: No Typology Code available for payment source

## 2016-05-09 DIAGNOSIS — C8118 Nodular sclerosis classical Hodgkin lymphoma, lymph nodes of multiple sites: Secondary | ICD-10-CM

## 2016-05-09 LAB — CBC WITH DIFFERENTIAL/PLATELET
BASO%: 1.2 % (ref 0.0–2.0)
Basophils Absolute: 0.1 10*3/uL (ref 0.0–0.1)
EOS ABS: 0.5 10*3/uL (ref 0.0–0.5)
EOS%: 10.1 % — ABNORMAL HIGH (ref 0.0–7.0)
HEMATOCRIT: 44.6 % (ref 38.4–49.9)
HEMOGLOBIN: 15.1 g/dL (ref 13.0–17.1)
LYMPH#: 1.1 10*3/uL (ref 0.9–3.3)
LYMPH%: 21.3 % (ref 14.0–49.0)
MCH: 31.8 pg (ref 27.2–33.4)
MCHC: 33.8 g/dL (ref 32.0–36.0)
MCV: 94.1 fL (ref 79.3–98.0)
MONO#: 0.7 10*3/uL (ref 0.1–0.9)
MONO%: 13.6 % (ref 0.0–14.0)
NEUT%: 53.8 % (ref 39.0–75.0)
NEUTROS ABS: 2.8 10*3/uL (ref 1.5–6.5)
Platelets: 243 10*3/uL (ref 140–400)
RBC: 4.74 10*6/uL (ref 4.20–5.82)
RDW: 13.3 % (ref 11.0–14.6)
WBC: 5.2 10*3/uL (ref 4.0–10.3)

## 2016-05-09 LAB — COMPREHENSIVE METABOLIC PANEL
ALBUMIN: 4.3 g/dL (ref 3.5–5.0)
ALK PHOS: 79 U/L (ref 40–150)
ALT: 17 U/L (ref 0–55)
AST: 20 U/L (ref 5–34)
Anion Gap: 8 mEq/L (ref 3–11)
BILIRUBIN TOTAL: 0.51 mg/dL (ref 0.20–1.20)
BUN: 8.1 mg/dL (ref 7.0–26.0)
CALCIUM: 9.3 mg/dL (ref 8.4–10.4)
CO2: 26 mEq/L (ref 22–29)
Chloride: 105 mEq/L (ref 98–109)
Creatinine: 0.9 mg/dL (ref 0.7–1.3)
GLUCOSE: 91 mg/dL (ref 70–140)
Potassium: 4.3 mEq/L (ref 3.5–5.1)
SODIUM: 138 meq/L (ref 136–145)
TOTAL PROTEIN: 6.9 g/dL (ref 6.4–8.3)

## 2016-05-09 NOTE — Telephone Encounter (Signed)
Patient stopped by scheduling to inform his provider that his PET Scan that was scheduled for today, had been rescheduled by Radiology scheduleing dept, due to him not fasting/NPO as was instructed. New PET appointment date is 05/18/16 at 7 a.m. Follow up with Dr Alvy Bimler for 05/10/16 was cancelled, due to test not being done. Provider has no availability until 05/29/16. Tried [facility], was unsuccessful. Message sent to provider to see when patient can be seen. Patient is aware of changes.. [place] 06[place]02/16. Awaiting Provider response. 05/09/16

## 2016-05-10 ENCOUNTER — Ambulatory Visit: Payer: No Typology Code available for payment source | Admitting: Hematology and Oncology

## 2016-05-17 ENCOUNTER — Telehealth: Payer: Self-pay | Admitting: Hematology and Oncology

## 2016-05-17 NOTE — Telephone Encounter (Signed)
Called patient to inform him of next scheduled appointments. °

## 2016-05-18 ENCOUNTER — Encounter (HOSPITAL_COMMUNITY)
Admission: RE | Admit: 2016-05-18 | Discharge: 2016-05-18 | Disposition: A | Discharge status: Home / Self Care | Payer: No Typology Code available for payment source | Source: Ambulatory Visit | Attending: Hematology and Oncology | Admitting: Hematology and Oncology

## 2016-05-18 DIAGNOSIS — C8118 Nodular sclerosis classical Hodgkin lymphoma, lymph nodes of multiple sites: Secondary | ICD-10-CM | POA: Insufficient documentation

## 2016-05-18 LAB — GLUCOSE, CAPILLARY: Glucose-Capillary: 97 mg/dL (ref 65–99)

## 2016-05-18 MED ORDER — FLUDEOXYGLUCOSE F - 18 (FDG) INJECTION
7.7700 | Freq: Once | INTRAVENOUS | Status: AC | PRN
  Administered 2016-05-18: 7.77 via INTRAVENOUS

## 2016-05-21 ENCOUNTER — Ambulatory Visit (HOSPITAL_BASED_OUTPATIENT_CLINIC_OR_DEPARTMENT_OTHER): Payer: No Typology Code available for payment source | Admitting: Hematology and Oncology

## 2016-05-21 ENCOUNTER — Encounter: Payer: Self-pay | Admitting: Hematology and Oncology

## 2016-05-21 ENCOUNTER — Telehealth: Payer: Self-pay | Admitting: Hematology and Oncology

## 2016-05-21 DIAGNOSIS — C8118 Nodular sclerosis classical Hodgkin lymphoma, lymph nodes of multiple sites: Secondary | ICD-10-CM | POA: Diagnosis not present

## 2016-05-21 DIAGNOSIS — D6851 Activated protein C resistance: Secondary | ICD-10-CM

## 2016-05-21 DIAGNOSIS — Z720 Tobacco use: Secondary | ICD-10-CM

## 2016-05-21 NOTE — Progress Notes (Signed)
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      07/28/2015 Pathology Results    Accession: [id number] core needle biopsy of the right supraclavicular region came back positive for classical Hodgkin lymphoma      08/22/2015 Bone Marrow Biopsy    Accession: [id number] Bone marrow is negative for involvement      08/22/2015 Procedure    He has port placement and bone marrow biopsy      08/23/2015 Imaging    ECHO showed normal EF      08/24/2015 PET scan    Bilateral hypermetabolic supraclavicular lymphadenopathy and mediastinal adenopathy consistent with known Hodgkin's lymphoma. 2. No disease is identified below the diaphragms.      08/25/2015 -  Chemotherapy    He is started on ABVD      09/19/2015 Imaging    Superficial thrombosis is noted in the left greater saphenous vein, from distal thigh extending up to groin. Non-occlusive DVT is extending into the left common femoral vein, at the   saphenofemoral junction.      09/22/2015 Adverse Reaction    Due to cough, Bleomycin is placed on hold      10/14/2015 PET scan    Marked reduction in size of the the thoracic and supraclavicular adenopathy, with interval resolution of the prior hypermetabolic activity indicating response to effective therapy. No significant residual hypermetabolic adenopathy.      03/28/2016 Procedure    Successful removal of implanted  Port-A-Cath.      05/18/2016 PET scan    No residual hypermetabolic lymph nodes in the neck or mediastinum ( Deauville 1). Small amount of residual thymus in the anterior mediastinum with activity equal to mediastinal activity ( Deauville 2 ). Favored differential of normal thymic tissue versus mild thymic rebound. Residual lymphoma is not favored but recommend attention on follow-up.       INTERVAL HISTORY: Please see below for problem oriented charting. He returns today with his wife Denies new lymphadenopathy Denies recent infection He continues to smoke Neuropathy has resolved  REVIEW OF SYSTEMS:   Constitutional: Denies fevers, chills or abnormal weight loss Eyes: Denies blurriness of vision Ears, nose, mouth, throat, and face: Denies mucositis or sore throat Respiratory: Denies cough, dyspnea or wheezes Cardiovascular: Denies palpitation, chest discomfort or lower extremity swelling Gastrointestinal:  Denies nausea, heartburn or change in bowel habits Skin: Denies abnormal skin rashes Lymphatics: Denies new lymphadenopathy or easy bruising Neurological:Denies numbness, tingling or new weaknesses Behavioral/Psych: Mood is stable, no new changes  All other systems were reviewed with the patient and are negative.  I have reviewed the past medical history, past surgical history, social history and family history with the patient and they are unchanged from previous note.  ALLERGIES:  has No Known Allergies.  MEDICATIONS:  Current Outpatient Prescriptions  Medication Sig Dispense Refill  . rivaroxaban (XARELTO) 20 MG TABS tablet Take 1 tablet (20 mg total) by mouth daily with supper. 30 tablet 9  .  ibuprofen (ADVIL,MOTRIN) 200 MG tablet Take 400-600 mg by mouth every 6 (six) hours as needed for headache.    . lidocaine-prilocaine (EMLA) cream Apply 1 application topically as needed (prior to accessing port).    . LORazepam (ATIVAN) 0.5 MG tablet Take 1 tablet (0.5 mg total) by  mouth 2 (two) times daily as needed for anxiety. (Patient not taking: Reported on 05/21/2016) 30 tablet 0  . Multiple Vitamin (MULTIVITAMIN) tablet Take 1 tablet by mouth daily.    . ondansetron (ZOFRAN) 8 MG tablet Take 8 mg by mouth 2 (two) times daily as needed for nausea or vomiting.    . prochlorperazine (COMPAZINE) 10 MG tablet Take 10 mg by mouth every 6 (six) hours as needed for nausea or vomiting.     No current facility-administered medications for this visit.     PHYSICAL EXAMINATION: ECOG PERFORMANCE STATUS: 0 - Asymptomatic  Vitals:   05/21/16 1437  BP: 123/67  Pulse: 73  Resp: 18  Temp: 98.1 F (36.7 C)   Filed Weights   05/21/16 1437  Weight: 148 lb 9.6 oz (67.4 kg)    GENERAL:alert, no distress and comfortable SKIN: skin color, texture, turgor are normal, no rashes or significant lesions EYES: normal, Conjunctiva are pink and non-injected, sclera clear Musculoskeletal:no cyanosis of digits and no clubbing  NEURO: alert & oriented x 3 with fluent speech, no focal motor/sensory deficits  LABORATORY DATA:  I have reviewed the data as listed    Component Value Date/Time   NA 138 05/09/2016 1112   K 4.3 05/09/2016 1112   CL 106 03/28/2016 0952   CO2 26 05/09/2016 1112   GLUCOSE 91 05/09/2016 1112   BUN 8.1 05/09/2016 1112   CREATININE 0.9 05/09/2016 1112   CALCIUM 9.3 05/09/2016 1112   PROT 6.9 05/09/2016 1112   ALBUMIN 4.3 05/09/2016 1112   AST 20 05/09/2016 1112   ALT 17 05/09/2016 1112   ALKPHOS 79 05/09/2016 1112   BILITOT 0.51 05/09/2016 1112   GFRNONAA >60 03/28/2016 0952   GFRAA >60 03/28/2016 0952    No results found for: SPEP, UPEP  Lab Results  Component Value Date   WBC 5.2 05/09/2016   NEUTROABS 2.8 05/09/2016   HGB 15.1 05/09/2016   HCT 44.6 05/09/2016   MCV 94.1 05/09/2016   PLT 243 05/09/2016      Chemistry      Component Value Date/Time   NA 138 05/09/2016 1112   K 4.3 05/09/2016 1112   CL 106 03/28/2016 0952   CO2 26  05/09/2016 1112   BUN 8.1 05/09/2016 1112   CREATININE 0.9 05/09/2016 1112      Component Value Date/Time   CALCIUM 9.3 05/09/2016 1112   ALKPHOS 79 05/09/2016 1112   AST 20 05/09/2016 1112   ALT 17 05/09/2016 1112   BILITOT 0.51 05/09/2016 1112       RADIOGRAPHIC STUDIES: I have personally reviewed the radiological images as listed and agreed with the findings in the report. Nm Pet Image Restag (ps) Skull Base To Thigh  Result Date: 05/18/2016 CLINICAL DATA:  Subsequent treatment strategy for classic Hodgkin's lymphoma. Patient status post ABVD chemotherapy. EXAM: NUCLEAR MEDICINE PET SKULL BASE TO THIGH TECHNIQUE: 7.7 mCi F-18 FDG was injected intravenously. Full-ring PET imaging was performed from the skull base to thigh after the radiotracer. CT data was obtained and used for attenuation correction and anatomic localization. FASTING BLOOD GLUCOSE:  Value: 99 mg/dl COMPARISON:  PET-CT scan 10/14/2015, 08/24/2015. FINDINGS: NECK No residual  metabolic activity within the RIGHT neck lymph nodes at site of prior hypermetabolic lymphoma (PET-CT 08/24/2015) CHEST Minimal activity associated with small amount residual thymic tissue in anterior mediastinum. This tissue is not changed by CT imaging. There is mild metabolic activity associated with this tissue with SUV max equal 3.0. This is increased from 2.2 on most recent FDG PET scan. This activity is similar to background mediastinal activity. No suspicious pulmonary nodules. ABDOMEN/PELVIS No abnormal metabolic activity lymph nodes the abdomen pelvis. Normal spleen which is normal volume. No abnormal activity in the liver. SKELETON No focal hypermetabolic activity to suggest skeletal metastasis. IMPRESSION: 1. No residual hypermetabolic lymph nodes in the neck or mediastinum ( Deauville 1) 2. Small amount of residual thymus in the anterior mediastinum with activity equal to mediastinal activity ( Deauville 2 ). Favored differential of normal thymic  tissue versus mild thymic rebound. Residual lymphoma is not favored but recommend attention on follow-up. Electronically Signed   By: Suzy Bouchard M.D.   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 He has recovered from most of the side effects of chemotherapy I plan to see him back in 3 months with repeat history and physical examination only and to defer imaging study until 6 months, next due in September 2018 He agreed  Tobacco abuse I spent some time counseling the patient the importance of tobacco cessation. he is currently attempting to quit on his own   Factor V Leiden [facility]) The patient has provoked DVT, on background history of smoking, diagnosis of Hodgkin lymphoma and factor V Leiden heterozygous mutation I recommend he complete 6 months of anticoagulation therapy until the end of January 2018. In the future, he will take 325 mg aspirin indefinitely   No orders of the defined types were placed in this encounter.  All questions were answered. The patient knows to call the clinic with any problems, questions or concerns. No barriers to learning was detected. I spent 15 minutes counseling the patient face to face. The total time spent in the appointment was 20 minutes and more than 50% was on counseling and review of test results     Heath Lark, MD 05/21/2016 3:14 PM

## 2016-05-21 NOTE — Assessment & Plan Note (Signed)
The patient has provoked DVT, on background history of smoking, diagnosis of Hodgkin lymphoma and factor V Leiden heterozygous mutation I recommend he complete 6 months of anticoagulation therapy until the end of January 2018. In the future, he will take 325 mg aspirin indefinitely

## 2016-05-21 NOTE — Assessment & Plan Note (Signed)
The patient has complete response to treatment. He has recovered from most of the side effects of chemotherapy I plan to see him back in 3 months with repeat history and physical examination only and to defer imaging study until 6 months, next due in September 2018 He agreed

## 2016-05-21 NOTE — Telephone Encounter (Signed)
Gave patient AVS and calender per 05/21/2016 los.  

## 2016-05-21 NOTE — Assessment & Plan Note (Signed)
I spent some time counseling the patient the importance of tobacco cessation. he is currently attempting to quit on his own 

## 2016-08-20 ENCOUNTER — Ambulatory Visit (HOSPITAL_BASED_OUTPATIENT_CLINIC_OR_DEPARTMENT_OTHER): Payer: No Typology Code available for payment source | Admitting: Hematology and Oncology

## 2016-08-20 ENCOUNTER — Encounter: Payer: Self-pay | Admitting: Hematology and Oncology

## 2016-08-20 ENCOUNTER — Ambulatory Visit (INDEPENDENT_AMBULATORY_CARE_PROVIDER_SITE_OTHER): Payer: No Typology Code available for payment source | Admitting: Family Medicine

## 2016-08-20 ENCOUNTER — Encounter: Payer: Self-pay | Admitting: Family Medicine

## 2016-08-20 ENCOUNTER — Telehealth: Payer: Self-pay | Admitting: Hematology and Oncology

## 2016-08-20 ENCOUNTER — Other Ambulatory Visit (HOSPITAL_BASED_OUTPATIENT_CLINIC_OR_DEPARTMENT_OTHER): Payer: No Typology Code available for payment source

## 2016-08-20 VITALS — BP 112/79 | HR 78 | Temp 99.0°F | Ht 72.0 in | Wt 144.0 lb

## 2016-08-20 DIAGNOSIS — C8118 Nodular sclerosis classical Hodgkin lymphoma, lymph nodes of multiple sites: Secondary | ICD-10-CM

## 2016-08-20 DIAGNOSIS — D721 Eosinophilia: Secondary | ICD-10-CM

## 2016-08-20 DIAGNOSIS — T675XXA Heat exhaustion, unspecified, initial encounter: Secondary | ICD-10-CM | POA: Diagnosis not present

## 2016-08-20 DIAGNOSIS — D6851 Activated protein C resistance: Secondary | ICD-10-CM | POA: Diagnosis not present

## 2016-08-20 DIAGNOSIS — T7840XD Allergy, unspecified, subsequent encounter: Secondary | ICD-10-CM

## 2016-08-20 DIAGNOSIS — R42 Dizziness and giddiness: Secondary | ICD-10-CM

## 2016-08-20 DIAGNOSIS — Z720 Tobacco use: Secondary | ICD-10-CM

## 2016-08-20 DIAGNOSIS — T7840XA Allergy, unspecified, initial encounter: Secondary | ICD-10-CM | POA: Insufficient documentation

## 2016-08-20 LAB — CBC WITH DIFFERENTIAL/PLATELET
BASO%: 1.3 % (ref 0.0–2.0)
Basophils Absolute: 0.1 10*3/uL (ref 0.0–0.1)
EOS%: 10 % — ABNORMAL HIGH (ref 0.0–7.0)
Eosinophils Absolute: 0.6 10*3/uL — ABNORMAL HIGH (ref 0.0–0.5)
HCT: 46 % (ref 38.4–49.9)
HEMOGLOBIN: 15.5 g/dL (ref 13.0–17.1)
LYMPH%: 24.2 % (ref 14.0–49.0)
MCH: 31.3 pg (ref 27.2–33.4)
MCHC: 33.8 g/dL (ref 32.0–36.0)
MCV: 92.6 fL (ref 79.3–98.0)
MONO#: 0.6 10*3/uL (ref 0.1–0.9)
MONO%: 10.5 % (ref 0.0–14.0)
NEUT%: 54 % (ref 39.0–75.0)
NEUTROS ABS: 3.1 10*3/uL (ref 1.5–6.5)
Platelets: 248 10*3/uL (ref 140–400)
RBC: 4.97 10*6/uL (ref 4.20–5.82)
RDW: 13.9 % (ref 11.0–14.6)
WBC: 5.7 10*3/uL (ref 4.0–10.3)
lymph#: 1.4 10*3/uL (ref 0.9–3.3)

## 2016-08-20 LAB — COMPREHENSIVE METABOLIC PANEL
ALBUMIN: 4 g/dL (ref 3.5–5.0)
ALT: 14 U/L (ref 0–55)
AST: 23 U/L (ref 5–34)
Alkaline Phosphatase: 81 U/L (ref 40–150)
Anion Gap: 10 mEq/L (ref 3–11)
BILIRUBIN TOTAL: 0.9 mg/dL (ref 0.20–1.20)
BUN: 8.4 mg/dL (ref 7.0–26.0)
CO2: 26 mEq/L (ref 22–29)
CREATININE: 1 mg/dL (ref 0.7–1.3)
Calcium: 9.3 mg/dL (ref 8.4–10.4)
Chloride: 105 mEq/L (ref 98–109)
GLUCOSE: 86 mg/dL (ref 70–140)
Potassium: 3.7 mEq/L (ref 3.5–5.1)
SODIUM: 141 meq/L (ref 136–145)
TOTAL PROTEIN: 6.9 g/dL (ref 6.4–8.3)

## 2016-08-20 NOTE — Progress Notes (Signed)
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      07/28/2015 Pathology Results    Accession: [id number] core needle biopsy of the right supraclavicular region came back positive for classical Hodgkin lymphoma      08/22/2015 Bone Marrow Biopsy    Accession: [id number] Bone marrow is negative for involvement      08/22/2015 Procedure    He has port placement and bone marrow biopsy      08/23/2015 Imaging    ECHO showed normal EF      08/24/2015 PET scan    Bilateral hypermetabolic supraclavicular lymphadenopathy and mediastinal adenopathy consistent with known Hodgkin's lymphoma. 2. No disease is identified below the diaphragms.      08/25/2015 -  Chemotherapy    He is started on ABVD      09/19/2015 Imaging    Superficial thrombosis is noted in the left greater saphenous vein, from distal thigh extending up to groin. Non-occlusive DVT is extending into the left common femoral vein, at the   saphenofemoral junction.      09/22/2015 Adverse Reaction    Due to cough, Bleomycin is placed on hold      10/14/2015 PET scan    Marked reduction in size of the the thoracic and supraclavicular adenopathy, with interval resolution of the prior hypermetabolic activity indicating response to effective therapy. No significant residual hypermetabolic adenopathy.      03/28/2016 Procedure    Successful removal of implanted  Port-A-Cath.      05/18/2016 PET scan    No residual hypermetabolic lymph nodes in the neck or mediastinum ( Deauville 1). Small amount of residual thymus in the anterior mediastinum with activity equal to mediastinal activity ( Deauville 2 ). Favored differential of normal thymic tissue versus mild thymic rebound. Residual lymphoma is not favored but recommend attention on follow-up.       INTERVAL HISTORY: Please see below for problem oriented charting. He returns for further follow-up He continues to have mild allergy symptoms with rhinitis He continues to smoke and attempting to quit smoking He denies new lymphadenopathy Denies recent infection The patient denies any recent signs or symptoms of bleeding such as spontaneous epistaxis, hematuria or hematochezia.  REVIEW OF SYSTEMS:   Constitutional: Denies fevers, chills or abnormal weight loss Eyes: Denies blurriness of vision Ears, nose, mouth, throat, and face: Denies mucositis or sore throat Respiratory: Denies cough, dyspnea or wheezes Cardiovascular: Denies palpitation, chest discomfort or lower extremity swelling Gastrointestinal:  Denies nausea, heartburn or change in bowel habits Skin: Denies abnormal skin rashes Lymphatics: Denies new lymphadenopathy or easy bruising Neurological:Denies numbness, tingling or new weaknesses Behavioral/Psych: Mood is stable, no new changes  All other systems were reviewed with the patient and are negative.  I have reviewed the past medical history, past surgical history, social history and family history with the patient and they are unchanged from previous note.  ALLERGIES:  has No Known Allergies.  MEDICATIONS:  Current Outpatient Prescriptions  Medication Sig Dispense Refill  . aspirin 325 MG tablet Take 325 mg by mouth daily.    Marland Kitchen ibuprofen (ADVIL,MOTRIN) 200 MG tablet Take 400-600 mg by mouth every 6 (six) hours as needed for headache.    Marland Kitchen LORazepam (ATIVAN) 0.5 MG tablet Take 1  tablet (0.5 mg total) by mouth 2 (two) times daily as needed for anxiety. 30 tablet 0  . Multiple Vitamin (MULTIVITAMIN) tablet Take 1 tablet by mouth daily.    . ondansetron (ZOFRAN) 8 MG tablet Take 8 mg by mouth 2 (two) times daily as needed for nausea or vomiting.    . prochlorperazine (COMPAZINE) 10 MG tablet Take 10 mg by mouth every 6 (six) hours as needed for nausea or vomiting.     No current facility-administered medications for this visit.     PHYSICAL EXAMINATION: ECOG PERFORMANCE STATUS: 0 - Asymptomatic  Vitals:   08/20/16 0835  BP: 113/68  Pulse: 68  Resp: 18  Temp: 98.1 F (36.7 C)   Filed Weights   08/20/16 0835  Weight: 143 lb 9.6 oz (65.1 kg)    GENERAL:alert, no distress and comfortable SKIN: skin color, texture, turgor are normal, no rashes or significant lesions EYES: normal, Conjunctiva are pink and non-injected, sclera clear OROPHARYNX:no exudate, no erythema and lips, buccal mucosa, and tongue normal  NECK: supple, thyroid normal size, non-tender, without nodularity LYMPH:  no palpable lymphadenopathy in the cervical, axillary or inguinal LUNGS: clear to auscultation and percussion with normal breathing effort HEART: regular rate & rhythm and no murmurs and no lower extremity edema ABDOMEN:abdomen soft, non-tender and normal bowel sounds Musculoskeletal:no cyanosis of digits and no clubbing  NEURO: alert & oriented x 3 with fluent speech, no focal motor/sensory deficits  LABORATORY DATA:  I have reviewed the data as listed    Component Value Date/Time   NA 141 08/20/2016 0810   K 3.7 08/20/2016 0810   CL 106 03/28/2016 0952   CO2 26 08/20/2016 0810   GLUCOSE 86 08/20/2016 0810   BUN 8.4 08/20/2016 0810   CREATININE 1.0 08/20/2016 0810   CALCIUM 9.3 08/20/2016 0810   PROT 6.9 08/20/2016 0810   ALBUMIN 4.0 08/20/2016 0810   AST 23 08/20/2016 0810   ALT 14 08/20/2016 0810   ALKPHOS 81 08/20/2016 0810   BILITOT 0.90 08/20/2016 0810   GFRNONAA  >60 03/28/2016 0952   GFRAA >60 03/28/2016 0952    No results found for: SPEP, UPEP  Lab Results  Component Value Date   WBC 5.7 08/20/2016   NEUTROABS 3.1 08/20/2016   HGB 15.5 08/20/2016   HCT 46.0 08/20/2016   MCV 92.6 08/20/2016   PLT 248 08/20/2016      Chemistry      Component Value Date/Time   NA 141 08/20/2016 0810   K 3.7 08/20/2016 0810   CL 106 03/28/2016 0952   CO2 26 08/20/2016 0810   BUN 8.4 08/20/2016 0810   CREATININE 1.0 08/20/2016 0810      Component Value Date/Time   CALCIUM 9.3 08/20/2016 0810   ALKPHOS 81 08/20/2016 0810   AST 23 08/20/2016 0810   ALT 14 08/20/2016 0810   BILITOT 0.90 08/20/2016 0810     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 He has recovered from most of the side effects of chemotherapy I plan to see him back in 3 months with repeat history and physical examination only  and to defer imaging study until 12 months, next due in march 2019 He agreed  Allergy He has chronic symptoms of allergic rhinitis Blood work show mild eosinophilia Recommend OTC allergy medications  Tobacco abuse I spent some time counseling the patient the importance of tobacco cessation. he is currently attempting to quit on his own   Factor V Leiden ([facility]) The patient has provoked DVT, on background history of smoking, diagnosis of Hodgkin lymphoma and factor V Leiden heterozygous mutation He had completed 6 months of anticoagulation therapy until the end of January 2018. For now, he will take 325 mg aspirin indefinitely for secondary prevention, due to his high risk situation along with chronic smoking   No orders of the defined types were placed in this encounter.  All questions were answered. The patient knows to call the clinic with any problems, questions or concerns. No barriers to learning was detected. I spent 15 minutes counseling  the patient face to face. The total time spent in the appointment was 20 minutes and more than 50% was on counseling and review of test results     Ni Gorsuch, MD 08/20/2016 9:54 AM  

## 2016-08-20 NOTE — Assessment & Plan Note (Signed)
The patient has provoked DVT, on background history of smoking, diagnosis of Hodgkin lymphoma and factor V Leiden heterozygous mutation He had completed 6 months of anticoagulation therapy until the end of January 2018. For now, he will take 325 mg aspirin indefinitely for secondary prevention, due to his high risk situation along with chronic smoking

## 2016-08-20 NOTE — Assessment & Plan Note (Signed)
He has chronic symptoms of allergic rhinitis Blood work show mild eosinophilia Recommend OTC allergy medications 

## 2016-08-20 NOTE — Assessment & Plan Note (Signed)
I spent some time counseling the patient the importance of tobacco cessation. he is currently attempting to quit on his own 

## 2016-08-20 NOTE — Assessment & Plan Note (Signed)
The patient has complete response to treatment. He has recovered from most of the side effects of chemotherapy I plan to see him back in 3 months with repeat history and physical examination only and to defer imaging study until 12 months, next due in march 2019 He agreed

## 2016-08-20 NOTE — Progress Notes (Signed)
BP 112/79   Pulse 78   Temp 99 F (37.2 C) (Oral)   Ht 6' (1.829 m)   Wt 144 lb (65.3 kg)   BMI 19.53 kg/m    Subjective:    Patient ID: Ross Hansen, male    DOB: December 09, 1985, 31 y.o.   MRN: [id number]  HPI: Ross Hansen is a 31 y.o. 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 Also during this episode he developed a hive-like rash that spread over his body but has since resolved and resolved very quickly. He does have a history of non-Hodgkin's lymphoma which is being managed and monitored by his hematologist/oncologist. Most recently they told him that he did have a higher eosinophil count and recommended that he go on an allergy pill but he had not started it yet. He denies having any chest pain or shortness of breath or wheezing during this episode. He denies any leg swelling or leg pain. Today he feels perfectly normal and fine.  Relevant past medical, surgical, family and social history reviewed and updated as indicated. Interim medical history since our last visit reviewed. Allergies and medications reviewed and updated.  Review of Systems  Constitutional: Negative for chills, fatigue and fever.  Eyes: Negative for discharge.  Respiratory: Negative for shortness of breath and wheezing.   Cardiovascular: Negative for chest pain and leg swelling.  Musculoskeletal: Negative for back pain and gait problem.  Skin: Negative for color change and rash.  Neurological: Positive for numbness. Negative for dizziness, weakness, light-headedness and headaches.  Psychiatric/Behavioral: Negative for  decreased concentration, dysphoric mood and suicidal ideas.  All other systems reviewed and are negative.   Per HPI unless specifically indicated above     Objective:    BP 112/79   Pulse 78   Temp 99 F (37.2 C) (Oral)   Ht 6' (1.829 m)   Wt 144 lb (65.3 kg)   BMI 19.53 kg/m   Wt Readings from Last 3 Encounters:  08/20/16 144 lb (65.3 kg)  08/20/16 143 lb 9.6 oz (65.1 kg)  05/21/16 148 lb 9.6 oz (67.4 kg)    Physical Exam  Constitutional: He is oriented to person, place, and time. He appears well-developed and well-nourished. No distress.  Eyes: Conjunctivae are normal. No scleral icterus.  Neck: Neck supple. No thyromegaly present.  Cardiovascular: Normal rate, regular rhythm, normal heart sounds and intact distal pulses.   No murmur heard. Pulmonary/Chest: Effort normal and breath sounds normal. No respiratory distress. He has no wheezes. He has no rales.  Musculoskeletal: Normal range of motion. He exhibits no edema.  Lymphadenopathy:    He has no cervical adenopathy.  Neurological: He is alert and oriented to person, place, and time. No cranial nerve deficit. He exhibits normal muscle tone. Coordination normal.  Skin: Skin is warm and dry. No rash noted. He is not diaphoretic.  Psychiatric: He has a normal mood and affect. His behavior is normal.  Nursing note and vitals reviewed.     Assessment & Plan:   Problem List Items Addressed This Visit  None    Visit Diagnoses    Heat exhaustion, initial encounter    -  Primary   At work where it was very hot patient seems to have had an episode of heating fashion. Recommended for him to not to stay hydrated with water and some electroly     Also instructed patient to talk to his oncologist about this and make sure that they do not see any problems. She had transient hives, recommended for him to take Claritin, the oncologist told him his eosinophil count was up  Follow up plan: Return if symptoms worsen or fail to  improve.  Counseling provided for all of the vaccine components No orders of the defined types were placed in this encounter.   Caryl Pina, MD [facility] 08/20/2016, 6:56 PM

## 2016-08-20 NOTE — Telephone Encounter (Signed)
Scheduled appt per 6/25 los - Gave patient AVS and calender per los.  

## 2016-08-24 ENCOUNTER — Telehealth: Payer: Self-pay | Admitting: *Deleted

## 2016-08-24 NOTE — Telephone Encounter (Signed)
Pt called wanting to update Dr. Alvy Bimler of heat exhaustion experience on Monday 6/25.   Spoke with pt, and was informed that Monday afternoon while at work, pt experienced  itching all over body including scalp, had red rashes all over body, feeling lightheadedness.  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 Pt checked BP reading with normal BP.   Pt was feeling better after being in a cool place, rash resolved after 2 hours. Stated he had been drinking lots of water.   Pt went to see his PCP, and was informed that pt experienced heat exhaustion, and was instructed to notify Dr. Calton Dach office of the experience as per PCP. Pt stated he is feeling much better today - like normal again.  Instructed pt to also drink Gatorade more when sweating a lot.  Informed pt that message will be relayed to Dr. Alvy Bimler for review on Monday - md is not in office toady. Pt's   Phone       [phone].

## 2016-08-27 NOTE — Telephone Encounter (Signed)
I have nothing to add I am glad he is better

## 2016-09-18 ENCOUNTER — Telehealth: Payer: Self-pay | Admitting: *Deleted

## 2016-09-18 NOTE — Telephone Encounter (Signed)
Pt left message stating he has a lymph node on his neck that is swollen. Wife anxious for him to have it checked.  LM for patient that Dr Alvy Bimler can see him on Thursday at 1030, with labs at 1000.  Please call back to confirm

## 2016-09-18 NOTE — Telephone Encounter (Signed)
Pt will be here on Thursday

## 2016-09-19 ENCOUNTER — Other Ambulatory Visit: Payer: Self-pay | Admitting: Hematology and Oncology

## 2016-09-19 DIAGNOSIS — C8118 Nodular sclerosis classical Hodgkin lymphoma, lymph nodes of multiple sites: Secondary | ICD-10-CM

## 2016-09-20 ENCOUNTER — Ambulatory Visit (HOSPITAL_BASED_OUTPATIENT_CLINIC_OR_DEPARTMENT_OTHER): Payer: No Typology Code available for payment source | Admitting: Hematology and Oncology

## 2016-09-20 ENCOUNTER — Other Ambulatory Visit (HOSPITAL_BASED_OUTPATIENT_CLINIC_OR_DEPARTMENT_OTHER): Payer: No Typology Code available for payment source

## 2016-09-20 ENCOUNTER — Encounter: Payer: Self-pay | Admitting: Hematology and Oncology

## 2016-09-20 ENCOUNTER — Telehealth: Payer: Self-pay | Admitting: *Deleted

## 2016-09-20 VITALS — BP 106/76 | HR 70 | Temp 98.1°F | Resp 20 | Ht 72.0 in | Wt 142.1 lb

## 2016-09-20 DIAGNOSIS — D6851 Activated protein C resistance: Secondary | ICD-10-CM

## 2016-09-20 DIAGNOSIS — Z720 Tobacco use: Secondary | ICD-10-CM

## 2016-09-20 DIAGNOSIS — C8118 Nodular sclerosis classical Hodgkin lymphoma, lymph nodes of multiple sites: Secondary | ICD-10-CM | POA: Diagnosis not present

## 2016-09-20 LAB — COMPREHENSIVE METABOLIC PANEL
ALK PHOS: 74 U/L (ref 40–150)
ALT: 13 U/L (ref 0–55)
ANION GAP: 8 meq/L (ref 3–11)
AST: 18 U/L (ref 5–34)
Albumin: 4.2 g/dL (ref 3.5–5.0)
BUN: 7.4 mg/dL (ref 7.0–26.0)
CHLORIDE: 105 meq/L (ref 98–109)
CO2: 27 meq/L (ref 22–29)
CREATININE: 0.9 mg/dL (ref 0.7–1.3)
Calcium: 9.4 mg/dL (ref 8.4–10.4)
EGFR: >90 mL/min/{1.73_m2} (ref >90)
Glucose: 75 mg/dl (ref 70–140)
Potassium: 4.3 mEq/L (ref 3.5–5.1)
Sodium: 140 mEq/L (ref 136–145)
TOTAL PROTEIN: 7 g/dL (ref 6.4–8.3)
Total Bilirubin: 0.5 mg/dL (ref 0.20–1.20)

## 2016-09-20 LAB — CBC WITH DIFFERENTIAL/PLATELET
BASO%: 1.5 % (ref 0.0–2.0)
Basophils Absolute: 0.1 10*3/uL (ref 0.0–0.1)
EOS%: 11.3 % — AB (ref 0.0–7.0)
Eosinophils Absolute: 0.6 10*3/uL — ABNORMAL HIGH (ref 0.0–0.5)
HEMATOCRIT: 46.9 % (ref 38.4–49.9)
HGB: 15.8 g/dL (ref 13.0–17.1)
LYMPH#: 1.2 10*3/uL (ref 0.9–3.3)
LYMPH%: 23 % (ref 14.0–49.0)
MCH: 31.7 pg (ref 27.2–33.4)
MCHC: 33.7 g/dL (ref 32.0–36.0)
MCV: 94 fL (ref 79.3–98.0)
MONO#: 0.5 10*3/uL (ref 0.1–0.9)
MONO%: 9.5 % (ref 0.0–14.0)
NEUT%: 54.7 % (ref 39.0–75.0)
NEUTROS ABS: 2.8 10*3/uL (ref 1.5–6.5)
PLATELETS: 224 10*3/uL (ref 140–400)
RBC: 4.99 10*6/uL (ref 4.20–5.82)
RDW: 14.1 % (ref 11.0–14.6)
WBC: 5.1 10*3/uL (ref 4.0–10.3)

## 2016-09-20 LAB — LACTATE DEHYDROGENASE: LDH: 155 U/L (ref 125–245)

## 2016-09-20 NOTE — Telephone Encounter (Signed)
-----   Message from Heath Lark, MD sent at 09/20/2016 11:01 AM EDT ----- Regarding: Dr. Lucia Gaskins ENT referral Pls call

## 2016-09-20 NOTE — Telephone Encounter (Signed)
Referral called to Dr Pollie Friar office for evaluation of lump in neck

## 2016-09-20 NOTE — Progress Notes (Signed)
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      07/28/2015 Pathology Results    Accession: [id number] core needle biopsy of the right supraclavicular region came back positive for classical Hodgkin lymphoma      08/22/2015 Bone Marrow Biopsy    Accession: [id number] Bone marrow is negative for involvement      08/22/2015 Procedure    He has port placement and bone marrow biopsy      08/23/2015 Imaging    ECHO showed normal EF      08/24/2015 PET scan    Bilateral hypermetabolic supraclavicular lymphadenopathy and mediastinal adenopathy consistent with known Hodgkin's lymphoma. 2. No disease is identified below the diaphragms.      08/25/2015 -  Chemotherapy    He is started on ABVD      09/19/2015 Imaging    Superficial thrombosis is noted in the left greater saphenous vein, from distal thigh extending up to groin. Non-occlusive DVT is extending into the left common femoral vein, at the   saphenofemoral junction.      09/22/2015 Adverse Reaction    Due to cough, Bleomycin is placed on hold      10/14/2015 PET scan    Marked reduction in size of the the thoracic and supraclavicular adenopathy, with interval resolution of the prior hypermetabolic activity indicating response to effective therapy. No significant residual hypermetabolic adenopathy.      03/28/2016 Procedure    Successful removal of implanted  Port-A-Cath.      05/18/2016 PET scan    No residual hypermetabolic lymph nodes in the neck or mediastinum ( Deauville 1). Small amount of residual thymus in the anterior mediastinum with activity equal to mediastinal activity ( Deauville 2 ). Favored differential of normal thymic tissue versus mild thymic rebound. Residual lymphoma is not favored but recommend attention on follow-up.       INTERVAL HISTORY: Please see below for problem oriented charting. He returns for further follow-up He is concerned about new subcutaneous nodule palpable in the left side of his neck lateral to SCM  He had a minor skin infection at the back of his neck He felt this nodule about a week ago He denies fever or chills No other lymphadenopathy The patient continues to smoke but is attempting to quit He takes aspirin daily The patient denies any recent signs or symptoms of bleeding such as spontaneous epistaxis, hematuria or hematochezia.  REVIEW OF SYSTEMS:   Constitutional: Denies fevers, chills or abnormal weight loss Eyes: Denies blurriness of vision Ears, nose, mouth, throat, and face: Denies mucositis or sore throat Respiratory: Denies cough, dyspnea or wheezes Cardiovascular: Denies palpitation, chest discomfort or lower extremity swelling Gastrointestinal:  Denies nausea, heartburn or change in bowel habits Skin: Denies abnormal skin rashes Neurological:Denies numbness, tingling or new weaknesses Behavioral/Psych: Mood is stable, no new changes  All other systems were reviewed with the patient and are negative.  I have reviewed the past medical history, past surgical  history, social history and family history with the patient and they are unchanged from previous note.  ALLERGIES:  has No Known Allergies.  MEDICATIONS:  Current Outpatient Prescriptions  Medication Sig Dispense Refill  . aspirin 325 MG tablet Take 325 mg by mouth daily.    Marland Kitchen ibuprofen (ADVIL,MOTRIN) 200 MG tablet Take  400-600 mg by mouth every 6 (six) hours as needed for headache.    Marland Kitchen LORazepam (ATIVAN) 0.5 MG tablet Take 1 tablet (0.5 mg total) by mouth 2 (two) times daily as needed for anxiety. 30 tablet 0  . Multiple Vitamin (MULTIVITAMIN) tablet Take 1 tablet by mouth daily.    . ondansetron (ZOFRAN) 8 MG tablet Take 8 mg by mouth 2 (two) times daily as needed for nausea or vomiting.    . prochlorperazine (COMPAZINE) 10 MG tablet Take 10 mg by mouth every 6 (six) hours as needed for nausea or vomiting.     No current facility-administered medications for this visit.     PHYSICAL EXAMINATION: ECOG PERFORMANCE STATUS: 0 - Asymptomatic  Vitals:   09/20/16 1019  BP: 106/76  Pulse: 70  Resp: 20  Temp: 98.1 F (36.7 C)   Filed Weights   09/20/16 1019  Weight: 142 lb 1.6 oz (64.5 kg)    GENERAL:alert, no distress and comfortable SKIN: There is minor skin infection on the back of his neck.  There is a small subcutaneous nodule measuring approximately 7 mm also lateral to the sternocleidomastoid EYES: normal, Conjunctiva are pink and non-injected, sclera clear OROPHARYNX:no exudate, no erythema and lips, buccal mucosa, and tongue normal  NECK: supple, thyroid normal size, non-tender, without nodularity LYMPH:  no palpable lymphadenopathy in the cervical, axillary or inguinal LUNGS: clear to auscultation and percussion with normal breathing effort HEART: regular rate & rhythm and no murmurs and no lower extremity edema ABDOMEN:abdomen soft, non-tender and normal bowel sounds Musculoskeletal:no cyanosis of digits and no clubbing  NEURO: alert & oriented x 3 with fluent speech, no focal motor/sensory deficits  LABORATORY DATA:  I have reviewed the data as listed    Component Value Date/Time   NA 140 09/20/2016 1006   K 4.3 09/20/2016 1006   CL 106 03/28/2016 0952   CO2 27 09/20/2016 1006   GLUCOSE 75 09/20/2016 1006   BUN 7.4 09/20/2016 1006   CREATININE 0.9 09/20/2016 1006   CALCIUM 9.4  09/20/2016 1006   PROT 7.0 09/20/2016 1006   ALBUMIN 4.2 09/20/2016 1006   AST 18 09/20/2016 1006   ALT 13 09/20/2016 1006   ALKPHOS 74 09/20/2016 1006   BILITOT 0.50 09/20/2016 1006   GFRNONAA >60 03/28/2016 0952   GFRAA >60 03/28/2016 0952    No results found for: SPEP, UPEP  Lab Results  Component Value Date   WBC 5.1 09/20/2016   NEUTROABS 2.8 09/20/2016   HGB 15.8 09/20/2016   HCT 46.9 09/20/2016   MCV 94.0 09/20/2016   PLT 224 09/20/2016      Chemistry      Component Value Date/Time   NA 140 09/20/2016 1006   K 4.3 09/20/2016 1006   CL 106 03/28/2016 0952   CO2 27 09/20/2016 1006   BUN 7.4 09/20/2016 1006   CREATININE 0.9 09/20/2016 1006      Component Value Date/Time   CALCIUM 9.4 09/20/2016 1006   ALKPHOS 74 09/20/2016 1006   AST 18 09/20/2016 1006   ALT 13 09/20/2016 1006   BILITOT 0.50 09/20/2016 1006       RADIOGRAPHIC STUDIES:  I have reviewed his prior PET CT scan I have personally reviewed the radiological images as listed and agreed with the findings in the report.   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 ([place]) His last imaging studies show no evidence of disease The palpable subcutaneous nodule in the left cervical region is very small and I am doubtful PET CT scan or CT imaging will be helpful at this point It is conceivable that the nodule could be reactive lymph node especially given presence of skin infection in his occipital region I recommend ENT consult to see if an FNA or biopsy is possible  Factor V Leiden ([place]) The patient had factor V Leiden mutation and prior history of thrombosis He is currently on 325 mg aspirin for secondary prevention I recommend reducing the dose of aspirin to 81 mg pending ENT evaluation in case biopsy is necessary  Tobacco abuse I spent some time counseling the patient the importance of tobacco cessation. he is currently attempting to quit on his  own    Orders Placed This Encounter  Procedures  . CBC with Differential/Platelet    Standing Status:   Future    Standing Expiration Date:   10/25/2017  . Comprehensive metabolic panel    Standing Status:   Future    Standing Expiration Date:   10/25/2017  . Ambulatory referral to ENT    Referral Priority:   Routine    Referral Type:   Consultation    Referral Reason:   Specialty Services Required    Referred to Provider:   Rozetta Nunnery, MD    Requested Specialty:   Otolaryngology    Number of Visits Requested:   1   All questions were answered. The patient knows to call the clinic with any problems, questions or concerns. No barriers to learning was detected. I spent 15 minutes counseling the patient face to face. The total time spent in the appointment was 20 minutes and more than 50% was on counseling and review of test results     Heath Lark, MD 09/20/2016 1:50 PM

## 2016-09-20 NOTE — Assessment & Plan Note (Signed)
His last imaging studies show no evidence of disease The palpable subcutaneous nodule in the left cervical region is very small and I am doubtful PET CT scan or CT imaging will be helpful at this point It is conceivable that the nodule could be reactive lymph node especially given presence of skin infection in his occipital region I recommend ENT consult to see if an FNA or biopsy is possible

## 2016-09-20 NOTE — Assessment & Plan Note (Signed)
I spent some time counseling the patient the importance of tobacco cessation. he is currently attempting to quit on his own 

## 2016-09-20 NOTE — Assessment & Plan Note (Signed)
The patient had factor V Leiden mutation and prior history of thrombosis He is currently on 325 mg aspirin for secondary prevention I recommend reducing the dose of aspirin to 81 mg pending ENT evaluation in case biopsy is necessary

## 2016-09-27 ENCOUNTER — Encounter: Payer: Self-pay | Admitting: Hematology and Oncology

## 2016-09-28 ENCOUNTER — Telehealth: Payer: Self-pay

## 2016-09-28 NOTE — Telephone Encounter (Signed)
Called office and ask if Dr. Lucia Gaskins can call Dr. Alvy Bimler with plan. Dr. Alvy Bimler cell number given.

## 2016-09-28 NOTE — Telephone Encounter (Signed)
-----   Message from Heath Lark, MD sent at 09/28/2016 10:38 AM EDT ----- Regarding: dr. Lucia Gaskins, ENT Can you ask if Dr. Lucia Gaskins has seen the patient and what was his plan?

## 2016-10-01 ENCOUNTER — Other Ambulatory Visit: Payer: Self-pay | Admitting: Hematology and Oncology

## 2016-10-01 DIAGNOSIS — C8118 Nodular sclerosis classical Hodgkin lymphoma, lymph nodes of multiple sites: Secondary | ICD-10-CM

## 2016-10-10 ENCOUNTER — Telehealth: Payer: Self-pay | Admitting: Hematology and Oncology

## 2016-10-10 ENCOUNTER — Ambulatory Visit (HOSPITAL_COMMUNITY)
Admission: RE | Admit: 2016-10-10 | Discharge: 2016-10-10 | Disposition: A | Discharge status: Home / Self Care | Payer: No Typology Code available for payment source | Source: Ambulatory Visit | Attending: Hematology and Oncology | Admitting: Hematology and Oncology

## 2016-10-10 ENCOUNTER — Telehealth: Payer: Self-pay

## 2016-10-10 DIAGNOSIS — C8118 Nodular sclerosis classical Hodgkin lymphoma, lymph nodes of multiple sites: Secondary | ICD-10-CM | POA: Diagnosis not present

## 2016-10-10 LAB — GLUCOSE, CAPILLARY: Glucose-Capillary: 89 mg/dL (ref 65–99)

## 2016-10-10 MED ORDER — FLUDEOXYGLUCOSE F - 18 (FDG) INJECTION
7.0100 | Freq: Once | INTRAVENOUS | Status: AC | PRN
  Administered 2016-10-10: 7.01 via INTRAVENOUS

## 2016-10-10 NOTE — Telephone Encounter (Signed)
Called with message. Verbalized understanding. It is okay to delay appt to November.

## 2016-10-10 NOTE — Telephone Encounter (Signed)
Scheduled appt per sch message from Dr. Alvy Bimler - left message with appt date and time and sent reminder letter in the mail.

## 2016-10-10 NOTE — Telephone Encounter (Signed)
-----   Message from Heath Lark, MD sent at 10/10/2016 10:58 AM EDT ----- Regarding: PET scan normal Tell him PET is normal If OK with them, I will delay next appt to November Let me know ----- Message ----- From: Interface, Rad Results In Sent: 10/10/2016  10:45 AM To: Heath Lark, MD

## 2016-11-20 ENCOUNTER — Other Ambulatory Visit: Payer: No Typology Code available for payment source

## 2016-11-20 ENCOUNTER — Ambulatory Visit: Payer: No Typology Code available for payment source | Admitting: Hematology and Oncology

## 2017-01-10 ENCOUNTER — Encounter: Payer: Self-pay | Admitting: Hematology and Oncology

## 2017-01-10 ENCOUNTER — Other Ambulatory Visit (HOSPITAL_BASED_OUTPATIENT_CLINIC_OR_DEPARTMENT_OTHER): Payer: No Typology Code available for payment source

## 2017-01-10 ENCOUNTER — Ambulatory Visit (HOSPITAL_BASED_OUTPATIENT_CLINIC_OR_DEPARTMENT_OTHER): Payer: No Typology Code available for payment source | Admitting: Hematology and Oncology

## 2017-01-10 VITALS — BP 130/87 | HR 96 | Temp 97.6°F | Resp 18 | Ht 72.0 in | Wt 145.2 lb

## 2017-01-10 DIAGNOSIS — C8118 Nodular sclerosis classical Hodgkin lymphoma, lymph nodes of multiple sites: Secondary | ICD-10-CM

## 2017-01-10 DIAGNOSIS — T7840XD Allergy, unspecified, subsequent encounter: Secondary | ICD-10-CM | POA: Diagnosis not present

## 2017-01-10 DIAGNOSIS — I82412 Acute embolism and thrombosis of left femoral vein: Secondary | ICD-10-CM | POA: Diagnosis not present

## 2017-01-10 DIAGNOSIS — Z23 Encounter for immunization: Secondary | ICD-10-CM

## 2017-01-10 DIAGNOSIS — Z720 Tobacco use: Secondary | ICD-10-CM | POA: Diagnosis not present

## 2017-01-10 LAB — CBC WITH DIFFERENTIAL/PLATELET
BASO%: 0.7 % (ref 0.0–2.0)
BASOS ABS: 0.1 10*3/uL (ref 0.0–0.1)
EOS ABS: 0.7 10*3/uL — AB (ref 0.0–0.5)
EOS%: 9.8 % — ABNORMAL HIGH (ref 0.0–7.0)
HEMATOCRIT: 44.2 % (ref 38.4–49.9)
HEMOGLOBIN: 15.3 g/dL (ref 13.0–17.1)
LYMPH#: 1.4 10*3/uL (ref 0.9–3.3)
LYMPH%: 21.3 % (ref 14.0–49.0)
MCH: 32 pg (ref 27.2–33.4)
MCHC: 34.6 g/dL (ref 32.0–36.0)
MCV: 92.5 fL (ref 79.3–98.0)
MONO#: 0.8 10*3/uL (ref 0.1–0.9)
MONO%: 11.7 % (ref 0.0–14.0)
NEUT#: 3.8 10*3/uL (ref 1.5–6.5)
NEUT%: 56.5 % (ref 39.0–75.0)
Platelets: 234 10*3/uL (ref 140–400)
RBC: 4.78 10*6/uL (ref 4.20–5.82)
RDW: 13.1 % (ref 11.0–14.6)
WBC: 6.8 10*3/uL (ref 4.0–10.3)

## 2017-01-10 LAB — COMPREHENSIVE METABOLIC PANEL
ALT: 14 U/L (ref 0–55)
ANION GAP: 8 meq/L (ref 3–11)
AST: 19 U/L (ref 5–34)
Albumin: 4.2 g/dL (ref 3.5–5.0)
Alkaline Phosphatase: 74 U/L (ref 40–150)
BILIRUBIN TOTAL: 0.65 mg/dL (ref 0.20–1.20)
BUN: 7.3 mg/dL (ref 7.0–26.0)
CHLORIDE: 104 meq/L (ref 98–109)
CO2: 27 meq/L (ref 22–29)
CREATININE: 0.9 mg/dL (ref 0.7–1.3)
Calcium: 9.4 mg/dL (ref 8.4–10.4)
EGFR: >60 mL/min/{1.73_m2} (ref >60)
GLUCOSE: 71 mg/dL (ref 70–140)
Potassium: 3.8 mEq/L (ref 3.5–5.1)
SODIUM: 139 meq/L (ref 136–145)
TOTAL PROTEIN: 7.3 g/dL (ref 6.4–8.3)

## 2017-01-10 MED ORDER — INFLUENZA VAC SPLIT QUAD 0.5 ML IM SUSY
0.5000 mL | PREFILLED_SYRINGE | Freq: Once | INTRAMUSCULAR | Status: AC
  Administered 2017-01-10: 0.5 mL via INTRAMUSCULAR
  Filled 2017-01-10: qty 0.5

## 2017-01-10 NOTE — Assessment & Plan Note (Addendum)
His last imaging studies show no evidence of disease Clinically, he has no signs of cancer recurrence I plan to see him back again in 3 months with history, physical examination, blood work and imaging study We discussed the importance of preventive care and reviewed the vaccination programs. He does not have any prior allergic reactions to influenza vaccination. He agrees to proceed with influenza vaccination today and we will administer it today at the clinic.

## 2017-01-10 NOTE — Assessment & Plan Note (Signed)
The patient had factor V Leiden mutation and prior history of thrombosis He is currently on 325 mg aspirin for secondary prevention 

## 2017-01-10 NOTE — Progress Notes (Signed)
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      07/28/2015 Pathology Results    Accession: [id number] core needle biopsy of the right supraclavicular region came back positive for classical Hodgkin lymphoma      08/22/2015 Bone Marrow Biopsy    Accession: [id number] Bone marrow is negative for involvement      08/22/2015 Procedure    He has port placement and bone marrow biopsy      08/23/2015 Imaging    ECHO showed normal EF      08/24/2015 PET scan    Bilateral hypermetabolic supraclavicular lymphadenopathy and mediastinal adenopathy consistent with known Hodgkin's lymphoma. 2. No disease is identified below the diaphragms.      08/25/2015 -  Chemotherapy    He is started on ABVD      09/19/2015 Imaging    Superficial thrombosis is noted in the left greater saphenous vein, from distal thigh extending up to groin. Non-occlusive DVT is extending into the left common femoral vein, at the   saphenofemoral junction.      09/22/2015 Adverse Reaction    Due to cough, Bleomycin is placed on hold      10/14/2015 PET scan    Marked reduction in size of the the thoracic and supraclavicular adenopathy, with interval resolution of the prior hypermetabolic activity indicating response to effective therapy. No significant residual hypermetabolic adenopathy.      03/28/2016 Procedure    Successful removal of implanted  Port-A-Cath.      05/18/2016 PET scan    No residual hypermetabolic lymph nodes in the neck or mediastinum ( Deauville 1). Small amount of residual thymus in the anterior mediastinum with activity equal to mediastinal activity ( Deauville 2 ). Favored differential of normal thymic tissue versus mild thymic rebound. Residual lymphoma is not favored but recommend attention on follow-up.      10/10/2016 PET scan    1. No evidence for hypermetabolic lymphadenopathy in the neck or chest on today's study (Deauville 1). 2. No change in the minimal FDG uptake identified in the thymic remnant on today's study (Deauville 2). This is probably physiologic and most likely unrelated to lymphoma       INTERVAL HISTORY: Please see below for problem oriented charting. He returns for further follow-up He continues to have mild allergy symptoms He is compliant taking aspirin as directed and denies bleeding complications He continues to smoke, almost a pack of cigarettes per day He denies new lymphadenopathy  REVIEW OF SYSTEMS:   Constitutional: Denies fevers, chills or abnormal weight loss Eyes: Denies blurriness of vision Ears, nose, mouth, throat, and face: Denies mucositis or sore throat Respiratory: Denies cough, dyspnea or wheezes Cardiovascular: Denies palpitation, chest discomfort or lower extremity swelling Gastrointestinal:  Denies nausea, heartburn or change in bowel habits Skin: Denies abnormal skin rashes Lymphatics: Denies new lymphadenopathy or easy bruising Neurological:Denies numbness, tingling or new weaknesses Behavioral/Psych: Mood is stable, no new changes  All other systems were  reviewed with the patient and are negative.  I have reviewed the past medical history, past surgical history, social history and family history with the patient and they are unchanged from previous note.  ALLERGIES:  has No Known Allergies.  MEDICATIONS:  Current Outpatient Medications  Medication Sig  Dispense Refill  . aspirin 325 MG tablet Take 325 mg by mouth daily.    Marland Kitchen ibuprofen (ADVIL,MOTRIN) 200 MG tablet Take 400-600 mg by mouth every 6 (six) hours as needed for headache.    Marland Kitchen LORazepam (ATIVAN) 0.5 MG tablet Take 1 tablet (0.5 mg total) by mouth 2 (two) times daily as needed for anxiety. 30 tablet 0  . Multiple Vitamin (MULTIVITAMIN) tablet Take 1 tablet by mouth daily.    . ondansetron (ZOFRAN) 8 MG tablet Take 8 mg by mouth 2 (two) times daily as needed for nausea or vomiting.    . prochlorperazine (COMPAZINE) 10 MG tablet Take 10 mg by mouth every 6 (six) hours as needed for nausea or vomiting.     No current facility-administered medications for this visit.     PHYSICAL EXAMINATION: ECOG PERFORMANCE STATUS: 0 - Asymptomatic  Vitals:   01/10/17 0929  BP: 130/87  Pulse: 96  Resp: 18  Temp: 97.6 F (36.4 C)  SpO2: 99%   Filed Weights   01/10/17 0929  Weight: 145 lb 3.2 oz (65.9 kg)    GENERAL:alert, no distress and comfortable SKIN: skin color, texture, turgor are normal, no rashes or significant lesions EYES: normal, Conjunctiva are pink and non-injected, sclera clear OROPHARYNX:no exudate, no erythema and lips, buccal mucosa, and tongue normal  NECK: supple, thyroid normal size, non-tender, without nodularity LYMPH:  no palpable lymphadenopathy in the cervical, axillary or inguinal LUNGS: clear to auscultation and percussion with normal breathing effort HEART: regular rate & rhythm and no murmurs and no lower extremity edema ABDOMEN:abdomen soft, non-tender and normal bowel sounds Musculoskeletal:no cyanosis of digits and no clubbing  NEURO: alert & oriented x 3 with fluent speech, no focal motor/sensory deficits  LABORATORY DATA:  I have reviewed the data as listed    Component Value Date/Time   NA 139 01/10/2017 0908   K 3.8 01/10/2017 0908   CL 106 03/28/2016 0952   CO2 27 01/10/2017 0908   GLUCOSE 71 01/10/2017 0908   BUN 7.3 01/10/2017 0908    CREATININE 0.9 01/10/2017 0908   CALCIUM 9.4 01/10/2017 0908   PROT 7.3 01/10/2017 0908   ALBUMIN 4.2 01/10/2017 0908   AST 19 01/10/2017 0908   ALT 14 01/10/2017 0908   ALKPHOS 74 01/10/2017 0908   BILITOT 0.65 01/10/2017 0908   GFRNONAA >60 03/28/2016 0952   GFRAA >60 03/28/2016 0952    No results found for: SPEP, UPEP  Lab Results  Component Value Date   WBC 6.8 01/10/2017   NEUTROABS 3.8 01/10/2017   HGB 15.3 01/10/2017   HCT 44.2 01/10/2017   MCV 92.5 01/10/2017   PLT 234 01/10/2017      Chemistry      Component Value Date/Time   NA 139 01/10/2017 0908   K 3.8 01/10/2017 0908   CL 106 03/28/2016 0952   CO2 27 01/10/2017 0908   BUN 7.3 01/10/2017 0908   CREATININE 0.9 01/10/2017 0908      Component Value Date/Time   CALCIUM 9.4 01/10/2017 0908   ALKPHOS 74 01/10/2017 0908   AST 19 01/10/2017 0908   ALT 14 01/10/2017 0908   BILITOT 0.65 01/10/2017 0908  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 He does not have any prior allergic reactions to influenza vaccination. He agrees to proceed with influenza vaccination today and we will administer it today at the clinic.   Tobacco abuse I spent some time counseling the patient the importance of tobacco cessation. he is currently attempting to quit on his own   Allergy He has chronic symptoms of allergic rhinitis Blood work show mild eosinophilia Recommend OTC allergy medications  Acute deep vein thrombosis (DVT) of left femoral vein ([facility]) The patient had factor V Leiden mutation and prior history of thrombosis He is currently on 325 mg aspirin for  secondary prevention   Orders Placed This Encounter  Procedures  . NM PET Image Restag (PS) Skull Base To Thigh    Standing Status:   Future    Number of Occurrences:   1    Standing Expiration Date:   01/10/2018    Order Specific Question:   If indicated for the ordered procedure, I authorize the administration of a radiopharmaceutical per Radiology protocol    Answer:   Yes    Order Specific Question:   Preferred imaging location?    Answer:   [facility]    Order Specific Question:   Radiology Contrast Protocol - do NOT remove file path    Answer:   [email or web address]  . NM PET Image Restag (PS) Skull Base To Thigh    Standing Status:   Future    Standing Expiration Date:   02/14/2018    Order Specific Question:   If indicated for the ordered procedure, I authorize the administration of a radiopharmaceutical per Radiology protocol    Answer:   Yes    Order Specific Question:   Preferred imaging location?    Answer:   [facility]    Order Specific Question:   Radiology Contrast Protocol - do NOT remove file path    Answer:   [email or web address]  . Comprehensive metabolic panel    Standing Status:   Future    Number of Occurrences:   1    Standing Expiration Date:   02/14/2018  . CBC with Differential/Platelet    Standing Status:   Future    Number of Occurrences:   1    Standing Expiration Date:   02/14/2018  . Lactate dehydrogenase    Standing Status:   Future    Number of Occurrences:   1    Standing Expiration Date:   01/10/2018  . Comprehensive metabolic panel    Standing Status:   Future    Standing Expiration Date:   02/14/2018  . CBC with Differential/Platelet    Standing Status:   Future    Standing Expiration Date:   02/14/2018  . Lactate dehydrogenase    Standing Status:   Future    Standing Expiration Date:   02/14/2018   All questions were answered. The patient knows to call the  clinic with any problems, questions or concerns. No barriers to learning was detected. I spent 15 minutes counseling the patient face to face. The total time spent in the appointment was 20 minutes and more than 50%  was on counseling and review of test results     Heath Lark, MD 01/10/2017 12:34 PM

## 2017-01-10 NOTE — Assessment & Plan Note (Signed)
He has chronic symptoms of allergic rhinitis Blood work show mild eosinophilia Recommend OTC allergy medications

## 2017-01-10 NOTE — Assessment & Plan Note (Signed)
I spent some time counseling the patient the importance of tobacco cessation. he is currently attempting to quit on his own 

## 2017-04-11 ENCOUNTER — Encounter (HOSPITAL_COMMUNITY): Payer: 59

## 2017-04-15 ENCOUNTER — Other Ambulatory Visit: Payer: Self-pay | Admitting: Hematology and Oncology

## 2017-04-15 ENCOUNTER — Telehealth: Payer: Self-pay | Admitting: *Deleted

## 2017-04-15 DIAGNOSIS — C8118 Nodular sclerosis classical Hodgkin lymphoma, lymph nodes of multiple sites: Secondary | ICD-10-CM

## 2017-04-15 NOTE — Telephone Encounter (Signed)
Left message for pt to call back to discuss schedule for CT. Pt called back & was informed of CT schedule & need to p/u contrast & get labs.  Pt expressed understanding & knows that PET was cancelled.

## 2017-04-16 ENCOUNTER — Telehealth: Payer: Self-pay | Admitting: *Deleted

## 2017-04-16 NOTE — Telephone Encounter (Signed)
Per Dr. Alvy Bimler, I spoke with patient regarding appointments for Monday, 04/22/17. He is coming in at 11:15 am instead of 1:00 pm to see Dr. Alvy Bimler. He verbalized understanding.

## 2017-04-17 ENCOUNTER — Encounter (HOSPITAL_COMMUNITY): Payer: 59

## 2017-04-18 ENCOUNTER — Ambulatory Visit (HOSPITAL_COMMUNITY)
Admission: RE | Admit: 2017-04-18 | Discharge: 2017-04-18 | Disposition: A | Discharge status: Home / Self Care | Payer: 59 | Source: Ambulatory Visit | Attending: Hematology and Oncology | Admitting: Hematology and Oncology

## 2017-04-18 ENCOUNTER — Inpatient Hospital Stay: Payer: 59 | Attending: Hematology and Oncology

## 2017-04-18 ENCOUNTER — Encounter (HOSPITAL_COMMUNITY): Payer: Self-pay

## 2017-04-18 DIAGNOSIS — Z79899 Other long term (current) drug therapy: Secondary | ICD-10-CM | POA: Insufficient documentation

## 2017-04-18 DIAGNOSIS — Z9221 Personal history of antineoplastic chemotherapy: Secondary | ICD-10-CM | POA: Insufficient documentation

## 2017-04-18 DIAGNOSIS — F17210 Nicotine dependence, cigarettes, uncomplicated: Secondary | ICD-10-CM | POA: Insufficient documentation

## 2017-04-18 DIAGNOSIS — C8118 Nodular sclerosis classical Hodgkin lymphoma, lymph nodes of multiple sites: Secondary | ICD-10-CM | POA: Diagnosis present

## 2017-04-18 DIAGNOSIS — I700 Atherosclerosis of aorta: Secondary | ICD-10-CM | POA: Insufficient documentation

## 2017-04-18 DIAGNOSIS — Z7982 Long term (current) use of aspirin: Secondary | ICD-10-CM | POA: Insufficient documentation

## 2017-04-18 DIAGNOSIS — Z8571 Personal history of Hodgkin lymphoma: Secondary | ICD-10-CM | POA: Diagnosis present

## 2017-04-18 DIAGNOSIS — Z86718 Personal history of other venous thrombosis and embolism: Secondary | ICD-10-CM | POA: Diagnosis not present

## 2017-04-18 LAB — COMPREHENSIVE METABOLIC PANEL
ALBUMIN: 4.3 g/dL (ref 3.5–5.0)
ALK PHOS: 84 U/L (ref 40–150)
ALT: 15 U/L (ref 0–55)
AST: 17 U/L (ref 5–34)
Anion gap: 9 (ref 3–11)
BUN: 8 mg/dL (ref 7–26)
CO2: 27 mmol/L (ref 22–29)
CREATININE: 0.91 mg/dL (ref 0.70–1.30)
Calcium: 9.5 mg/dL (ref 8.4–10.4)
Chloride: 103 mmol/L (ref 98–109)
GFR calc Af Amer: >60 mL/min (ref >60)
GLUCOSE: 91 mg/dL (ref 70–140)
Potassium: 4.2 mmol/L (ref 3.5–5.1)
Sodium: 139 mmol/L (ref 136–145)
TOTAL PROTEIN: 7.4 g/dL (ref 6.4–8.3)
Total Bilirubin: 0.7 mg/dL (ref 0.2–1.2)

## 2017-04-18 LAB — CBC WITH DIFFERENTIAL/PLATELET
BASOS ABS: 0.1 10*3/uL (ref 0.0–0.1)
Basophils Relative: 1 %
Eosinophils Absolute: 0.5 10*3/uL (ref 0.0–0.5)
Eosinophils Relative: 9 %
HCT: 47.6 % (ref 38.4–49.9)
HEMOGLOBIN: 16 g/dL (ref 13.0–17.1)
LYMPHS PCT: 23 %
Lymphs Abs: 1.3 10*3/uL (ref 0.9–3.3)
MCH: 31.7 pg (ref 27.2–33.4)
MCHC: 33.7 g/dL (ref 32.0–36.0)
MCV: 94 fL (ref 79.3–98.0)
MONO ABS: 0.5 10*3/uL (ref 0.1–0.9)
Monocytes Relative: 9 %
NEUTROS ABS: 3.3 10*3/uL (ref 1.5–6.5)
NEUTROS PCT: 58 %
Platelets: 288 10*3/uL (ref 140–400)
RBC: 5.06 MIL/uL (ref 4.20–5.82)
RDW: 13.3 % (ref 11.0–14.6)
WBC: 5.7 10*3/uL (ref 4.0–10.3)

## 2017-04-18 LAB — LACTATE DEHYDROGENASE: LDH: 169 U/L (ref 125–245)

## 2017-04-18 MED ORDER — IOPAMIDOL (ISOVUE-300) INJECTION 61%
100.0000 mL | Freq: Once | INTRAVENOUS | Status: AC | PRN
  Administered 2017-04-18: 100 mL via INTRAVENOUS

## 2017-04-18 MED ORDER — IOPAMIDOL (ISOVUE-300) INJECTION 61%
INTRAVENOUS | Status: AC
  Administered 2017-04-18: 100 mL via INTRAVENOUS
  Filled 2017-04-18: qty 100

## 2017-04-22 ENCOUNTER — Encounter: Payer: Self-pay | Admitting: Hematology and Oncology

## 2017-04-22 ENCOUNTER — Inpatient Hospital Stay (HOSPITAL_BASED_OUTPATIENT_CLINIC_OR_DEPARTMENT_OTHER): Payer: 59 | Admitting: Hematology and Oncology

## 2017-04-22 ENCOUNTER — Telehealth: Payer: Self-pay | Admitting: Hematology and Oncology

## 2017-04-22 DIAGNOSIS — I700 Atherosclerosis of aorta: Secondary | ICD-10-CM | POA: Diagnosis not present

## 2017-04-22 DIAGNOSIS — Z8571 Personal history of Hodgkin lymphoma: Secondary | ICD-10-CM

## 2017-04-22 DIAGNOSIS — F17210 Nicotine dependence, cigarettes, uncomplicated: Secondary | ICD-10-CM

## 2017-04-22 DIAGNOSIS — Z79899 Other long term (current) drug therapy: Secondary | ICD-10-CM | POA: Diagnosis not present

## 2017-04-22 DIAGNOSIS — Z86718 Personal history of other venous thrombosis and embolism: Secondary | ICD-10-CM | POA: Diagnosis not present

## 2017-04-22 DIAGNOSIS — Z9221 Personal history of antineoplastic chemotherapy: Secondary | ICD-10-CM | POA: Diagnosis not present

## 2017-04-22 DIAGNOSIS — Z7982 Long term (current) use of aspirin: Secondary | ICD-10-CM | POA: Diagnosis not present

## 2017-04-22 DIAGNOSIS — Z720 Tobacco use: Secondary | ICD-10-CM

## 2017-04-22 DIAGNOSIS — C8118 Nodular sclerosis classical Hodgkin lymphoma, lymph nodes of multiple sites: Secondary | ICD-10-CM

## 2017-04-22 NOTE — Telephone Encounter (Signed)
Gave avs and calendar june

## 2017-04-22 NOTE — Progress Notes (Signed)
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   INTERVAL HISTORY: Please see below for problem oriented charting. 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      07/28/2015 Pathology Results    Accession: [id number] core needle biopsy of the right supraclavicular region came back positive for classical Hodgkin lymphoma      08/22/2015 Bone Marrow Biopsy    Accession: [id number] Bone marrow is negative for involvement      08/22/2015 Procedure    He has port placement and bone marrow biopsy      08/23/2015 Imaging    ECHO showed normal EF      08/24/2015 PET scan    Bilateral  hypermetabolic supraclavicular lymphadenopathy and mediastinal adenopathy consistent with known Hodgkin's lymphoma. 2. No disease is identified below the diaphragms.      08/25/2015 -  Chemotherapy    He is started on ABVD      09/19/2015 Imaging    Superficial thrombosis is noted in the left greater saphenous vein, from distal thigh extending up to groin. Non-occlusive DVT is extending into the left common femoral vein, at the   saphenofemoral junction.      09/22/2015 Adverse Reaction    Due to cough, Bleomycin is placed on hold      10/14/2015 PET scan    Marked reduction in size of the the thoracic and supraclavicular adenopathy, with interval resolution of the prior hypermetabolic activity indicating response to effective therapy. No significant residual hypermetabolic adenopathy.      03/28/2016 Procedure    Successful removal of implanted Port-A-Cath.      05/18/2016 PET scan    No residual hypermetabolic lymph nodes in the neck or mediastinum ( Deauville 1). Small amount of residual thymus in the anterior mediastinum with activity equal to mediastinal activity ( Deauville 2 ). Favored differential of normal thymic tissue versus mild thymic rebound. Residual lymphoma is not favored but recommend attention on follow-up.      10/10/2016 PET scan    1. No evidence for hypermetabolic lymphadenopathy in the neck or chest on today's study (Deauville 1). 2. No change in the minimal FDG uptake identified in the thymic remnant on today's study (Deauville 2). This is probably physiologic and  most likely unrelated to lymphoma      04/18/2017 Imaging    1. No evidence of active lymphoma or acute process in the chest, abdomen, or pelvis. 2. Similar residual thymic tissue in the anterior mediastinum, within normal variation in this age group. 3. Aortic Atherosclerosis (ICD10-I70.0). This is markedly age advanced.       REVIEW OF SYSTEMS:   Constitutional: Denies fevers, chills or abnormal  weight loss Eyes: Denies blurriness of vision Ears, nose, mouth, throat, and face: Denies mucositis or sore throat Respiratory: Denies cough, dyspnea or wheezes Cardiovascular: Denies palpitation, chest discomfort or lower extremity swelling Gastrointestinal:  Denies nausea, heartburn or change in bowel habits Skin: Denies abnormal skin rashes Lymphatics: Denies new lymphadenopathy or easy bruising Neurological:Denies numbness, tingling or new weaknesses Behavioral/Psych: Mood is stable, no new changes  All other systems were reviewed with the patient and are negative.  I have reviewed the past medical history, past surgical history, social history and family history with the patient and they are unchanged from previous note.  ALLERGIES:  has No Known Allergies.  MEDICATIONS:  Current Outpatient Medications  Medication Sig Dispense Refill  . aspirin 325 MG tablet Take 325 mg by mouth daily.    Marland Kitchen ibuprofen (ADVIL,MOTRIN) 200 MG tablet Take 400-600 mg by mouth every 6 (six) hours as needed for headache.    . Multiple Vitamin (MULTIVITAMIN) tablet Take 1 tablet by mouth daily.     No current facility-administered medications for this visit.     PHYSICAL EXAMINATION: ECOG PERFORMANCE STATUS: 0 - Asymptomatic  Vitals:   04/22/17 1115  BP: 116/73  Pulse: 74  Resp: 20  Temp: 97.7 F (36.5 C)  SpO2: 100%   Filed Weights   04/22/17 1115  Weight: 144 lb 12.8 oz (65.7 kg)    GENERAL:alert, no distress and comfortable SKIN: skin color, texture, turgor are normal, no rashes or significant lesions EYES: normal, Conjunctiva are pink and non-injected, sclera clear OROPHARYNX:no exudate, no erythema and lips, buccal mucosa, and tongue normal  NECK: supple, thyroid normal size, non-tender, without nodularity LYMPH:  no palpable lymphadenopathy in the cervical, axillary or inguinal LUNGS: clear to auscultation and percussion with normal breathing effort HEART: regular rate & rhythm and  no murmurs and no lower extremity edema ABDOMEN:abdomen soft, non-tender and normal bowel sounds Musculoskeletal:no cyanosis of digits and no clubbing  NEURO: alert & oriented x 3 with fluent speech, no focal motor/sensory deficits  LABORATORY DATA:  I have reviewed the data as listed    Component Value Date/Time   NA 139 04/18/2017 0847   NA 139 01/10/2017 0908   K 4.2 04/18/2017 0847   K 3.8 01/10/2017 0908   CL 103 04/18/2017 0847   CO2 27 04/18/2017 0847   CO2 27 01/10/2017 0908   GLUCOSE 91 04/18/2017 0847   GLUCOSE 71 01/10/2017 0908   BUN 8 04/18/2017 0847   BUN 7.3 01/10/2017 0908   CREATININE 0.91 04/18/2017 0847   CREATININE 0.9 01/10/2017 0908   CALCIUM 9.5 04/18/2017 0847   CALCIUM 9.4 01/10/2017 0908   PROT 7.4 04/18/2017 0847   PROT 7.3 01/10/2017 0908   ALBUMIN 4.3 04/18/2017 0847   ALBUMIN 4.2 01/10/2017 0908   AST 17 04/18/2017 0847   AST 19 01/10/2017 0908   ALT 15 04/18/2017 0847   ALT 14 01/10/2017 0908   ALKPHOS 84 04/18/2017 0847   ALKPHOS 74 01/10/2017 0908   BILITOT 0.7 04/18/2017 0847   BILITOT 0.65 01/10/2017 0908  GFRNONAA >60 04/18/2017 0847   GFRAA >60 04/18/2017 0847    No results found for: SPEP, UPEP  Lab Results  Component Value Date   WBC 5.7 04/18/2017   NEUTROABS 3.3 04/18/2017   HGB 16.0 04/18/2017   HCT 47.6 04/18/2017   MCV 94.0 04/18/2017   PLT 288 04/18/2017      Chemistry      Component Value Date/Time   NA 139 04/18/2017 0847   NA 139 01/10/2017 0908   K 4.2 04/18/2017 0847   K 3.8 01/10/2017 0908   CL 103 04/18/2017 0847   CO2 27 04/18/2017 0847   CO2 27 01/10/2017 0908   BUN 8 04/18/2017 0847   BUN 7.3 01/10/2017 0908   CREATININE 0.91 04/18/2017 0847   CREATININE 0.9 01/10/2017 0908      Component Value Date/Time   CALCIUM 9.5 04/18/2017 0847   CALCIUM 9.4 01/10/2017 0908   ALKPHOS 84 04/18/2017 0847   ALKPHOS 74 01/10/2017 0908   AST 17 04/18/2017 0847   AST 19 01/10/2017 0908   ALT 15 04/18/2017  0847   ALT 14 01/10/2017 0908   BILITOT 0.7 04/18/2017 0847   BILITOT 0.65 01/10/2017 0908       RADIOGRAPHIC STUDIES: I have personally reviewed the radiological images as listed and agreed with the findings in the report. Ct Chest W Contrast  Result Date: 04/18/2017 CLINICAL DATA:  Hodgkin's lymphoma diagnosed 5/17. Chemotherapy complete. Asymptomatic. EXAM: CT CHEST, ABDOMEN, AND PELVIS WITH CONTRAST TECHNIQUE: Multidetector CT imaging of the chest, abdomen and pelvis was performed following the standard protocol during bolus administration of intravenous contrast. CONTRAST:  100 cc of Isovue-300 COMPARISON:  10/10/2016 PET FINDINGS: CT CHEST FINDINGS Cardiovascular: Normal aortic caliber. Normal heart size, without pericardial effusion. No central pulmonary embolism, on this non-dedicated study. Mediastinum/Nodes: No supraclavicular adenopathy. No axillary adenopathy. Small middle mediastinal nodes are similar, including on image 25/2. None meet size criteria for pathologic enlargement. No hilar adenopathy. Residual thymic tissue in the anterior mediastinum is not significantly changed, including on image 28/2. Lungs/Pleura: No pleural fluid. Minimal biapical pleuroparenchymal scarring. Musculoskeletal: No acute osseous abnormality. CT ABDOMEN PELVIS FINDINGS Hepatobiliary: Normal liver. Normal gallbladder, without biliary ductal dilatation. Pancreas: Normal, without mass or ductal dilatation. Spleen: Normal in size, without focal abnormality. Adrenals/Urinary Tract: Normal adrenal glands. Normal kidneys, without hydronephrosis. Normal urinary bladder. Stomach/Bowel: Is Normal stomach, without wall thickening. Normal colon and terminal ileum. Appendix not visualized. Normal small bowel. Vascular/Lymphatic: Aortic and branch vessel atherosclerosis. Circumaortic left renal vein. No abdominopelvic adenopathy. Reproductive: Normal prostate. Other: No significant free fluid. No evidence of omental or  peritoneal disease. Musculoskeletal: No acute osseous abnormality. IMPRESSION: 1. No evidence of active lymphoma or acute process in the chest, abdomen, or pelvis. 2. Similar residual thymic tissue in the anterior mediastinum, within normal variation in this age group. 3. Aortic Atherosclerosis (ICD10-I70.0). This is markedly age advanced. Electronically Signed   By: Abigail Miyamoto M.D.   On: 04/18/2017 12:32   Ct Abdomen Pelvis W Contrast  Result Date: 04/18/2017 CLINICAL DATA:  Hodgkin's lymphoma diagnosed 5/17. Chemotherapy complete. Asymptomatic. EXAM: CT CHEST, ABDOMEN, AND PELVIS WITH CONTRAST TECHNIQUE: Multidetector CT imaging of the chest, abdomen and pelvis was performed following the standard protocol during bolus administration of intravenous contrast. CONTRAST:  100 cc of Isovue-300 COMPARISON:  10/10/2016 PET FINDINGS: CT CHEST FINDINGS Cardiovascular: Normal aortic caliber. Normal heart size, without pericardial effusion. No central pulmonary embolism, on this non-dedicated study. Mediastinum/Nodes: No supraclavicular adenopathy. No axillary  adenopathy. Small middle mediastinal nodes are similar, including on image 25/2. None meet size criteria for pathologic enlargement. No hilar adenopathy. Residual thymic tissue in the anterior mediastinum is not significantly changed, including on image 28/2. Lungs/Pleura: No pleural fluid. Minimal biapical pleuroparenchymal scarring. Musculoskeletal: No acute osseous abnormality. CT ABDOMEN PELVIS FINDINGS Hepatobiliary: Normal liver. Normal gallbladder, without biliary ductal dilatation. Pancreas: Normal, without mass or ductal dilatation. Spleen: Normal in size, without focal abnormality. Adrenals/Urinary Tract: Normal adrenal glands. Normal kidneys, without hydronephrosis. Normal urinary bladder. Stomach/Bowel: Is Normal stomach, without wall thickening. Normal colon and terminal ileum. Appendix not visualized. Normal small bowel. Vascular/Lymphatic: Aortic  and branch vessel atherosclerosis. Circumaortic left renal vein. No abdominopelvic adenopathy. Reproductive: Normal prostate. Other: No significant free fluid. No evidence of omental or peritoneal disease. Musculoskeletal: No acute osseous abnormality. IMPRESSION: 1. No evidence of active lymphoma or acute process in the chest, abdomen, or pelvis. 2. Similar residual thymic tissue in the anterior mediastinum, within normal variation in this age group. 3. Aortic Atherosclerosis (ICD10-I70.0). This is markedly age advanced. Electronically Signed   By: Abigail Miyamoto M.D.   On: 04/18/2017 12:32    All questions were answered. The patient knows to call the clinic with any problems, questions or concerns. No barriers to learning was detected.  I spent 15 minutes counseling the patient face to face. The total time spent in the appointment was 20 minutes and more than 50% was on counseling and review of test results  Heath Lark, MD 04/22/2017 11:30 AM

## 2017-04-22 NOTE — Assessment & Plan Note (Signed)
CT imaging is negative for recurrence of disease I recommend return visit in 4 months with history, physical examination and blood work We will CT imaging early next year

## 2017-04-22 NOTE — Assessment & Plan Note (Signed)
He is attempting to quit smoking I congratulated his efforts

## 2017-04-22 NOTE — Assessment & Plan Note (Signed)
The patient had factor V Leiden mutation and prior history of thrombosis He is currently on 325 mg aspirin for secondary prevention 

## 2017-05-16 ENCOUNTER — Ambulatory Visit (INDEPENDENT_AMBULATORY_CARE_PROVIDER_SITE_OTHER): Payer: 59 | Admitting: Family Medicine

## 2017-05-16 VITALS — BP 108/68 | HR 90 | Temp 98.7°F | Ht 72.0 in | Wt 143.6 lb

## 2017-05-16 DIAGNOSIS — B370 Candidal stomatitis: Secondary | ICD-10-CM | POA: Diagnosis not present

## 2017-05-16 DIAGNOSIS — I700 Atherosclerosis of aorta: Secondary | ICD-10-CM | POA: Diagnosis not present

## 2017-05-16 MED ORDER — CLOTRIMAZOLE 10 MG MT TROC: 10.0000 mg | Freq: Every day | OROMUCOSAL | 0 refills | Status: DC

## 2017-05-16 NOTE — Patient Instructions (Signed)
Great to see you!  It looks like you have an overgrowth of yeast in your mouth called thrush.   We will check your immune system to be sure everything looks ok.

## 2017-05-16 NOTE — Progress Notes (Signed)
   HPI  Patient presents today here with oral lesions.  Patient explains that 4-5 days ago he had 2 days of malaise and fatigue. These have resolved. Now he has begun developing small oral sores. He has one on the tip at this time, one under his tongue.  He is also had some white discoloration of the tongue. No other illness. No recent chemotherapy.  Patient has history of lymphoma, last treatment was in December and there is no evidence of recurrence of disease currently.  Is also noted to recent lymph nodes one in the right submandibular area and one in the posterior right cervical chain, the right some mandibular is already improving, the right cervical chain started today.  PMH: Smoking status noted ROS: Per HPI  Objective: BP 108/68   Pulse 90   Temp 98.7 F (37.1 C) (Oral)   Ht 6' (1.829 m)   Wt 143 lb 9.6 oz (65.1 kg)   BMI 19.48 kg/m  Gen: NAD, alert, cooperative with exam HEENT: NCAT, oropharynx with white discoloration of the tongue and 2 extremely tiny less than 2 mm lesions one located on the tip of the tongue and one under the tongue CV: RRR, good S1/S2, no murmur Resp: CTABL, no wheezes, non-labored Neuro: Alert and oriented, No gross deficits  Assessment and plan:  #Thrush Most consistent with thrush Treat with clotrimazole troches Discussed very low threshold for seeking care with oncology of his lymph nodes do not iresolve within 2 weeks. He is already feeling better from his recent illness, it could be viral illness and thrush secondary..  Also possible viral related oral lesions.  Aortic atherosclerosis New problem to me, seen on CT scan Discussed with patient Stop smoking Labs today, it is arguable whether or not he warrants statins if his cholesterol is elevated considering his age.   Orders Placed This Encounter  Procedures  . CBC with Differential/Platelet  . Lipid panel  . CMP14+EGFR    Meds ordered this encounter  Medications  .  clotrimazole (MYCELEX) 10 MG troche    Sig: Take 1 tablet (10 mg total) by mouth 5 (five) times daily.    Dispense:  70 tablet    Refill:  0    Ross Apple, MD [facility] 05/16/2017, 1:21 PM

## 2017-05-17 LAB — CBC WITH DIFFERENTIAL/PLATELET
BASOS ABS: 0.1 10*3/uL (ref 0.0–0.2)
Basos: 1 %
EOS (ABSOLUTE): 0.6 10*3/uL — AB (ref 0.0–0.4)
Eos: 10 %
Hematocrit: 42.3 % (ref 37.5–51.0)
Hemoglobin: 14.8 g/dL (ref 13.0–17.7)
Immature Grans (Abs): 0 10*3/uL (ref 0.0–0.1)
Immature Granulocytes: 0 %
LYMPHS ABS: 1.5 10*3/uL (ref 0.7–3.1)
Lymphs: 27 %
MCH: 32.4 pg (ref 26.6–33.0)
MCHC: 35 g/dL (ref 31.5–35.7)
MCV: 93 fL (ref 79–97)
MONOCYTES: 14 %
MONOS ABS: 0.8 10*3/uL (ref 0.1–0.9)
Neutrophils Absolute: 2.6 10*3/uL (ref 1.4–7.0)
Neutrophils: 48 %
PLATELETS: 229 10*3/uL (ref 150–379)
RBC: 4.57 x10E6/uL (ref 4.14–5.80)
RDW: 13.7 % (ref 12.3–15.4)
WBC: 5.5 10*3/uL (ref 3.4–10.8)

## 2017-05-17 LAB — CMP14+EGFR
A/G RATIO: 2.1 (ref 1.2–2.2)
ALT: 15 IU/L (ref 0–44)
AST: 19 IU/L (ref 0–40)
Albumin: 4.6 g/dL (ref 3.5–5.5)
Alkaline Phosphatase: 78 IU/L (ref 39–117)
BUN/Creatinine Ratio: 9 (ref 9–20)
BUN: 7 mg/dL (ref 6–20)
Bilirubin Total: 0.3 mg/dL (ref 0.0–1.2)
CALCIUM: 9 mg/dL (ref 8.7–10.2)
CO2: 23 mmol/L (ref 20–29)
Chloride: 101 mmol/L (ref 96–106)
Creatinine, Ser: 0.81 mg/dL (ref 0.76–1.27)
GFR, EST AFRICAN AMERICAN: 135 mL/min/{1.73_m2} (ref >59)
GFR, EST NON AFRICAN AMERICAN: 117 mL/min/{1.73_m2} (ref >59)
GLOBULIN, TOTAL: 2.2 g/dL (ref 1.5–4.5)
Glucose: 85 mg/dL (ref 65–99)
POTASSIUM: 4.2 mmol/L (ref 3.5–5.2)
SODIUM: 140 mmol/L (ref 134–144)
TOTAL PROTEIN: 6.8 g/dL (ref 6.0–8.5)

## 2017-05-17 LAB — LIPID PANEL
CHOLESTEROL TOTAL: 125 mg/dL (ref 100–199)
Chol/HDL Ratio: 3.4 ratio (ref 0.0–5.0)
HDL: 37 mg/dL — ABNORMAL LOW (ref >39)
LDL Calculated: 75 mg/dL (ref 0–99)
TRIGLYCERIDES: 63 mg/dL (ref 0–149)
VLDL Cholesterol Cal: 13 mg/dL (ref 5–40)

## 2017-08-20 ENCOUNTER — Telehealth: Payer: Self-pay | Admitting: Hematology and Oncology

## 2017-08-20 ENCOUNTER — Encounter: Payer: Self-pay | Admitting: Hematology and Oncology

## 2017-08-20 ENCOUNTER — Inpatient Hospital Stay: Payer: 59

## 2017-08-20 ENCOUNTER — Inpatient Hospital Stay: Payer: 59 | Attending: Hematology and Oncology | Admitting: Hematology and Oncology

## 2017-08-20 ENCOUNTER — Other Ambulatory Visit: Payer: Self-pay | Admitting: Hematology and Oncology

## 2017-08-20 DIAGNOSIS — Z8572 Personal history of non-Hodgkin lymphomas: Secondary | ICD-10-CM | POA: Diagnosis present

## 2017-08-20 DIAGNOSIS — I700 Atherosclerosis of aorta: Secondary | ICD-10-CM

## 2017-08-20 DIAGNOSIS — Z7982 Long term (current) use of aspirin: Secondary | ICD-10-CM | POA: Insufficient documentation

## 2017-08-20 DIAGNOSIS — Z9221 Personal history of antineoplastic chemotherapy: Secondary | ICD-10-CM | POA: Insufficient documentation

## 2017-08-20 DIAGNOSIS — F17210 Nicotine dependence, cigarettes, uncomplicated: Secondary | ICD-10-CM | POA: Diagnosis not present

## 2017-08-20 DIAGNOSIS — C8118 Nodular sclerosis classical Hodgkin lymphoma, lymph nodes of multiple sites: Secondary | ICD-10-CM

## 2017-08-20 DIAGNOSIS — Z86718 Personal history of other venous thrombosis and embolism: Secondary | ICD-10-CM | POA: Insufficient documentation

## 2017-08-20 DIAGNOSIS — Z720 Tobacco use: Secondary | ICD-10-CM

## 2017-08-20 LAB — COMPREHENSIVE METABOLIC PANEL
ALBUMIN: 4.3 g/dL (ref 3.5–5.0)
ALT: 12 U/L (ref 0–44)
AST: 18 U/L (ref 15–41)
Alkaline Phosphatase: 81 U/L (ref 38–126)
Anion gap: 9 (ref 5–15)
BILIRUBIN TOTAL: 0.6 mg/dL (ref 0.3–1.2)
BUN: 11 mg/dL (ref 6–20)
CHLORIDE: 104 mmol/L (ref 98–111)
CO2: 23 mmol/L (ref 22–32)
Calcium: 9.1 mg/dL (ref 8.9–10.3)
Creatinine, Ser: 1.15 mg/dL (ref 0.61–1.24)
GFR calc Af Amer: >60 mL/min (ref >60)
GFR calc non Af Amer: >60 mL/min (ref >60)
GLUCOSE: 101 mg/dL — AB (ref 70–99)
POTASSIUM: 4.1 mmol/L (ref 3.5–5.1)
Sodium: 136 mmol/L (ref 135–145)
TOTAL PROTEIN: 7 g/dL (ref 6.5–8.1)

## 2017-08-20 LAB — CBC WITH DIFFERENTIAL/PLATELET
BASOS ABS: 0.1 10*3/uL (ref 0.0–0.1)
BASOS PCT: 1 %
EOS ABS: 0.4 10*3/uL (ref 0.0–0.5)
EOS PCT: 9 %
HEMATOCRIT: 43.1 % (ref 38.4–49.9)
Hemoglobin: 15.1 g/dL (ref 13.0–17.1)
Lymphocytes Relative: 30 %
Lymphs Abs: 1.5 10*3/uL (ref 0.9–3.3)
MCH: 32 pg (ref 27.2–33.4)
MCHC: 35 g/dL (ref 32.0–36.0)
MCV: 91.3 fL (ref 79.3–98.0)
MONO ABS: 0.5 10*3/uL (ref 0.1–0.9)
MONOS PCT: 9 %
Neutro Abs: 2.4 10*3/uL (ref 1.5–6.5)
Neutrophils Relative %: 51 %
PLATELETS: 230 10*3/uL (ref 140–400)
RBC: 4.72 MIL/uL (ref 4.20–5.82)
RDW: 13 % (ref 11.0–14.6)
WBC: 4.8 10*3/uL (ref 4.0–10.3)

## 2017-08-20 LAB — LACTATE DEHYDROGENASE: LDH: 290 U/L — AB (ref 98–192)

## 2017-08-20 NOTE — Assessment & Plan Note (Signed)
The patient had factor V Leiden mutation and prior history of thrombosis He is currently on 325 mg aspirin for secondary prevention

## 2017-08-20 NOTE — Assessment & Plan Note (Signed)
He is attempting to quit smoking

## 2017-08-20 NOTE — Progress Notes (Signed)
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   INTERVAL HISTORY: Please see below for problem oriented charting. He returns for lymphoma follow-up He feels well No new lymphadenopathy Denies cough, chest pain or shortness of breath He continues to smoke but is attempting to quit smoking He is taking aspirin therapy to prevent DVT.  Denies recent leg swelling The patient denies any recent signs or symptoms of bleeding such as spontaneous epistaxis, hematuria or hematochezia.   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      07/28/2015 Pathology Results    Accession: [id number] core needle biopsy of the right supraclavicular region came back positive for classical Hodgkin lymphoma      08/22/2015 Bone Marrow Biopsy    Accession: [id number] Bone marrow is negative for involvement      08/22/2015  Procedure    He has port placement and bone marrow biopsy      08/23/2015 Imaging    ECHO showed normal EF      08/24/2015 PET scan    Bilateral hypermetabolic supraclavicular lymphadenopathy and mediastinal adenopathy consistent with known Hodgkin's lymphoma. 2. No disease is identified below the diaphragms.      08/25/2015 -  Chemotherapy    He is started on ABVD      09/19/2015 Imaging    Superficial thrombosis is noted in the left greater saphenous vein, from distal thigh extending up to groin. Non-occlusive DVT is extending into the left common femoral vein, at the   saphenofemoral junction.      09/22/2015 Adverse Reaction    Due to cough, Bleomycin is placed on hold      10/14/2015 PET scan    Marked reduction in size of the the thoracic and supraclavicular adenopathy, with interval resolution of the prior hypermetabolic activity indicating response to effective therapy. No significant residual hypermetabolic adenopathy.      03/28/2016 Procedure    Successful removal of implanted Port-A-Cath.      05/18/2016 PET scan    No residual hypermetabolic lymph nodes in the neck or mediastinum ( Deauville 1). Small amount of residual thymus in the anterior mediastinum with activity equal to mediastinal activity ( Deauville 2 ). Favored differential of normal thymic tissue versus mild thymic rebound. Residual lymphoma is not favored but recommend attention on follow-up.      10/10/2016 PET scan    1. No evidence for hypermetabolic lymphadenopathy in the neck or chest on today's  study (Deauville 1). 2. No change in the minimal FDG uptake identified in the thymic remnant on today's study (Deauville 2). This is probably physiologic and most likely unrelated to lymphoma      04/18/2017 Imaging    1. No evidence of active lymphoma or acute process in the chest, abdomen, or pelvis. 2. Similar residual thymic tissue in the anterior mediastinum, within normal variation in this age group. 3.  Aortic Atherosclerosis (ICD10-I70.0). This is markedly age advanced.       REVIEW OF SYSTEMS:   Constitutional: Denies fevers, chills or abnormal weight loss Eyes: Denies blurriness of vision Ears, nose, mouth, throat, and face: Denies mucositis or sore throat Respiratory: Denies cough, dyspnea or wheezes Cardiovascular: Denies palpitation, chest discomfort or lower extremity swelling Gastrointestinal:  Denies nausea, heartburn or change in bowel habits Skin: Denies abnormal skin rashes Lymphatics: Denies new lymphadenopathy or easy bruising Neurological:Denies numbness, tingling or new weaknesses Behavioral/Psych: Mood is stable, no new changes  All other systems were reviewed with the patient and are negative.  I have reviewed the past medical history, past surgical history, social history and family history with the patient and they are unchanged from previous note.  ALLERGIES:  has No Known Allergies.  MEDICATIONS:  Current Outpatient Medications  Medication Sig Dispense Refill  . aspirin 325 MG tablet Take 325 mg by mouth daily.    Marland Kitchen ibuprofen (ADVIL,MOTRIN) 200 MG tablet Take 400-600 mg by mouth every 6 (six) hours as needed for headache.    . Multiple Vitamin (MULTIVITAMIN) tablet Take 1 tablet by mouth daily.     No current facility-administered medications for this visit.     PHYSICAL EXAMINATION: ECOG PERFORMANCE STATUS: 0 - Asymptomatic  Vitals:   08/20/17 1153  BP: 107/76  Pulse: 89  Resp: 18  Temp: 98.3 F (36.8 C)  SpO2: 98%   Filed Weights   08/20/17 1153  Weight: 141 lb 4.8 oz (64.1 kg)    GENERAL:alert, no distress and comfortable SKIN: skin color, texture, turgor are normal, no rashes or significant lesions EYES: normal, Conjunctiva are pink and non-injected, sclera clear OROPHARYNX:no exudate, no erythema and lips, buccal mucosa, and tongue normal  NECK: supple, thyroid normal size, non-tender, without nodularity LYMPH:  no palpable  lymphadenopathy in the cervical, axillary or inguinal LUNGS: clear to auscultation and percussion with normal breathing effort HEART: regular rate & rhythm and no murmurs and no lower extremity edema ABDOMEN:abdomen soft, non-tender and normal bowel sounds Musculoskeletal:no cyanosis of digits and no clubbing  NEURO: alert & oriented x 3 with fluent speech, no focal motor/sensory deficits  LABORATORY DATA:  I have reviewed the data as listed    Component Value Date/Time   NA 136 08/20/2017 1135   NA 140 05/16/2017 1346   NA 139 01/10/2017 0908   K 4.1 08/20/2017 1135   K 3.8 01/10/2017 0908   CL 104 08/20/2017 1135   CO2 23 08/20/2017 1135   CO2 27 01/10/2017 0908   GLUCOSE 101 (H) 08/20/2017 1135   GLUCOSE 71 01/10/2017 0908   BUN 11 08/20/2017 1135   BUN 7 05/16/2017 1346   BUN 7.3 01/10/2017 0908   CREATININE 1.15 08/20/2017 1135   CREATININE 0.9 01/10/2017 0908   CALCIUM 9.1 08/20/2017 1135   CALCIUM 9.4 01/10/2017 0908   PROT 7.0 08/20/2017 1135   PROT 6.8 05/16/2017 1346   PROT 7.3 01/10/2017 0908   ALBUMIN 4.3 08/20/2017 1135   ALBUMIN 4.6 05/16/2017 1346   ALBUMIN 4.2  01/10/2017 0908   AST 18 08/20/2017 1135   AST 19 01/10/2017 0908   ALT 12 08/20/2017 1135   ALT 14 01/10/2017 0908   ALKPHOS 81 08/20/2017 1135   ALKPHOS 74 01/10/2017 0908   BILITOT 0.6 08/20/2017 1135   BILITOT 0.3 05/16/2017 1346   BILITOT 0.65 01/10/2017 0908   GFRNONAA >60 08/20/2017 1135   GFRAA >60 08/20/2017 1135    No results found for: SPEP, UPEP  Lab Results  Component Value Date   WBC 4.8 08/20/2017   NEUTROABS 2.4 08/20/2017   HGB 15.1 08/20/2017   HCT 43.1 08/20/2017   MCV 91.3 08/20/2017   PLT 230 08/20/2017      Chemistry      Component Value Date/Time   NA 136 08/20/2017 1135   NA 140 05/16/2017 1346   NA 139 01/10/2017 0908   K 4.1 08/20/2017 1135   K 3.8 01/10/2017 0908   CL 104 08/20/2017 1135   CO2 23 08/20/2017 1135   CO2 27 01/10/2017 0908   BUN 11  08/20/2017 1135   BUN 7 05/16/2017 1346   BUN 7.3 01/10/2017 0908   CREATININE 1.15 08/20/2017 1135   CREATININE 0.9 01/10/2017 0908      Component Value Date/Time   CALCIUM 9.1 08/20/2017 1135   CALCIUM 9.4 01/10/2017 0908   ALKPHOS 81 08/20/2017 1135   ALKPHOS 74 01/10/2017 0908   AST 18 08/20/2017 1135   AST 19 01/10/2017 0908   ALT 12 08/20/2017 1135   ALT 14 01/10/2017 0908   BILITOT 0.6 08/20/2017 1135   BILITOT 0.3 05/16/2017 1346   BILITOT 0.65 01/10/2017 0908       All questions were answered. The patient knows to call the clinic with any problems, questions or concerns. No barriers to learning was detected.  I spent 10 minutes counseling the patient face to face. The total time spent in the appointment was 15 minutes and more than 50% was on counseling and review of test results  Heath Lark, MD 08/20/2017 1:58 PM

## 2017-08-20 NOTE — Assessment & Plan Note (Signed)
Clinically, he has no signs or symptoms of cancer recurrence I plan to follow with history, physical examination and blood work only I will defer imaging study until next year

## 2017-12-15 IMAGING — CT NM PET TUM IMG RESTAG (PS) SKULL BASE T - THIGH
8 series · 25 of 25 positions shown · non-contrast
Comparison: PET-CT scan 10/14/2015, 08/24/2015.

CLINICAL DATA: Subsequent treatment strategy for classic Hodgkin's
lymphoma. Patient status post ABVD chemotherapy.

EXAM:
NUCLEAR MEDICINE PET SKULL BASE TO THIGH
TECHNIQUE: 7.7 mCi F-18 FDG was injected intravenously. Full-ring PET imaging
was performed from the skull base to thigh after the radiotracer. CT
data was obtained and used for attenuation correction and anatomic
localization.
FASTING BLOOD GLUCOSE:  Value: 99 mg/dl

[Series 3: pet sk_thigh ac · axial · 5.0mm · 4.07mm/px · z∈[-962,-74]mm · 4 of 223 slices shown]
[im 1/223]
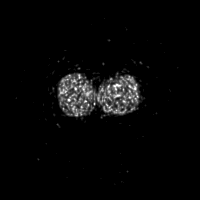
[im 75/223]
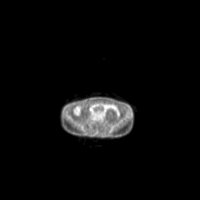
[im 149/223]
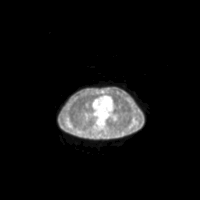
[im 223/223]
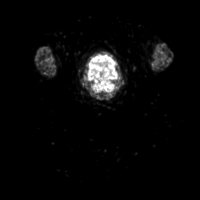

[Series 4: ct sk_thigh 5.0 b31f · axial · 5.0mm · 0.98mm/px · z∈[-962,-74]mm · 5 of 223 slices shown]
[im 1/223]
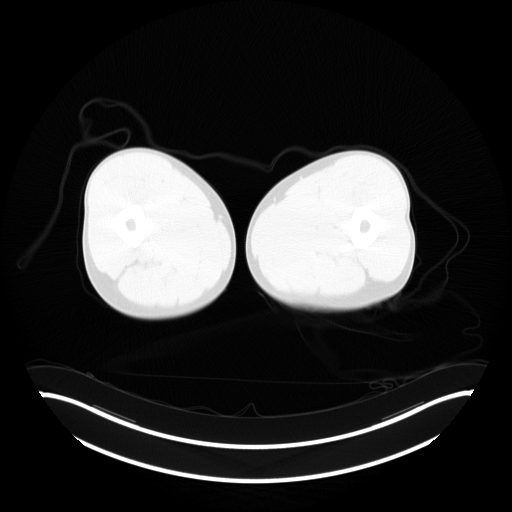
[im 56/223]
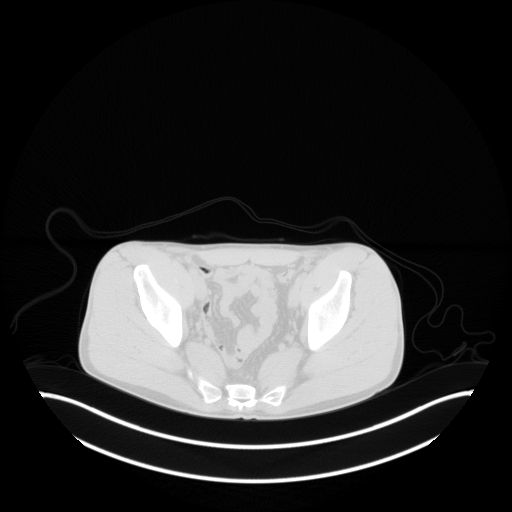
[im 112/223]
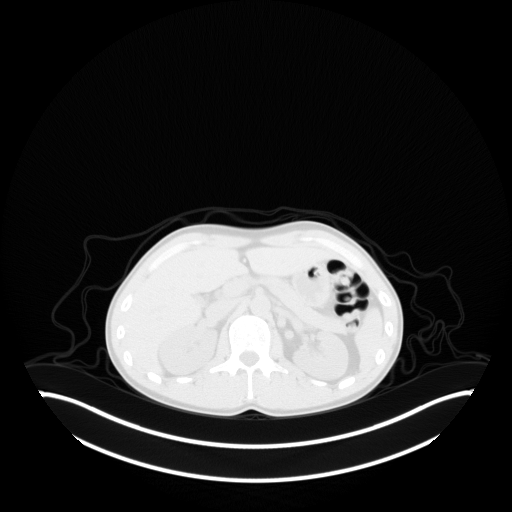
[im 167/223]
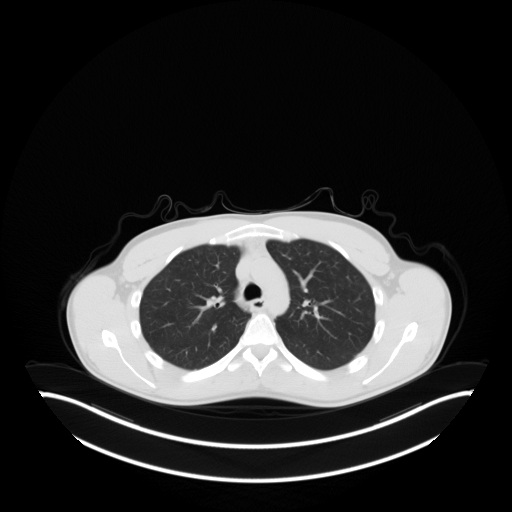
[im 223/223  brain]
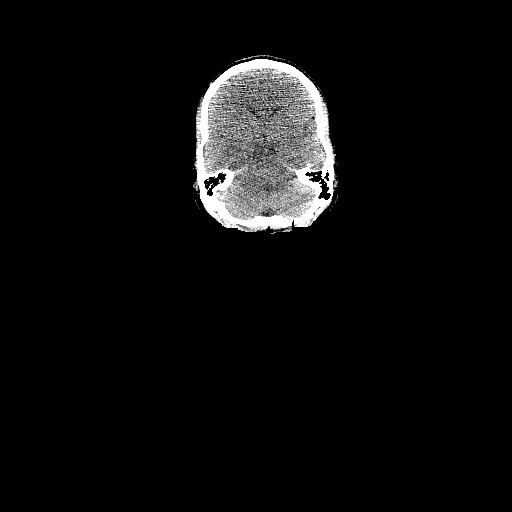

[Series 7: ct sk_thigh 5.0 b70f (id)_bone · axial · 5.0mm · 0.68mm/px · z∈[-502,-194]mm · 2 of 78 slices shown]
[im 1/78  bone]
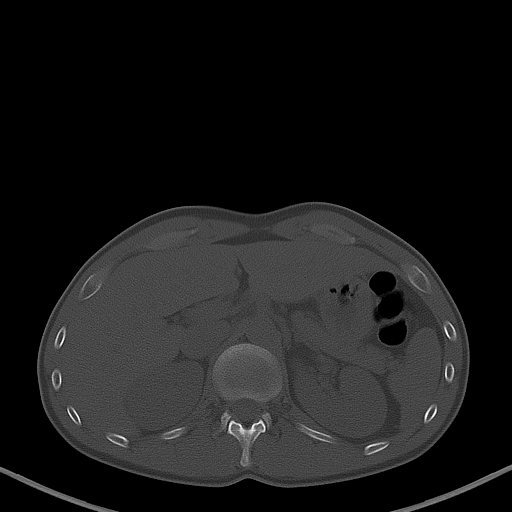
[im 78/78  bone]
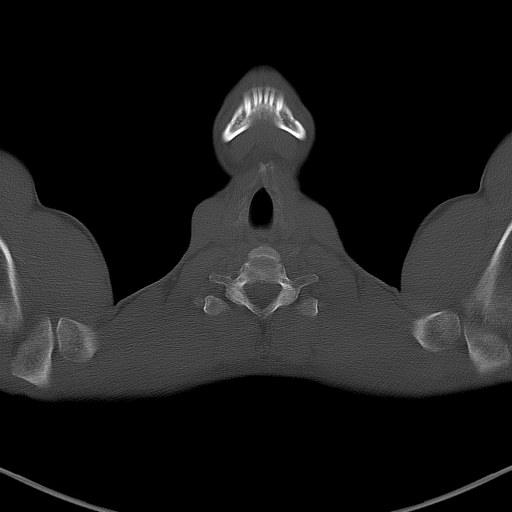

[Series 8: pet sk_thigh nac · axial · 5.0mm · 4.07mm/px · z∈[-962,-74]mm · 5 of 223 slices shown]
[im 1/223]
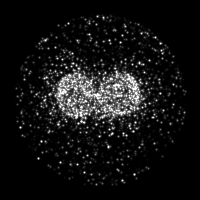
[im 56/223]
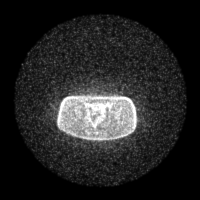
[im 112/223]
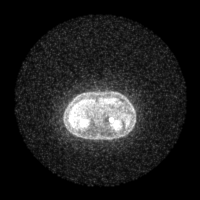
[im 167/223]
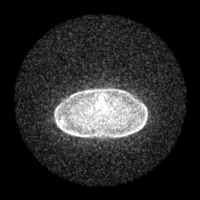
[im 223/223]
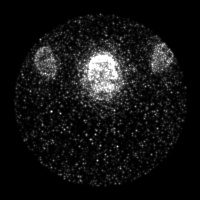

[Series 604: range-ct sk_thigh 5.0 (id)<alpha range> · 2 of 69 slices shown (1 of 2)]
[im 1/69]
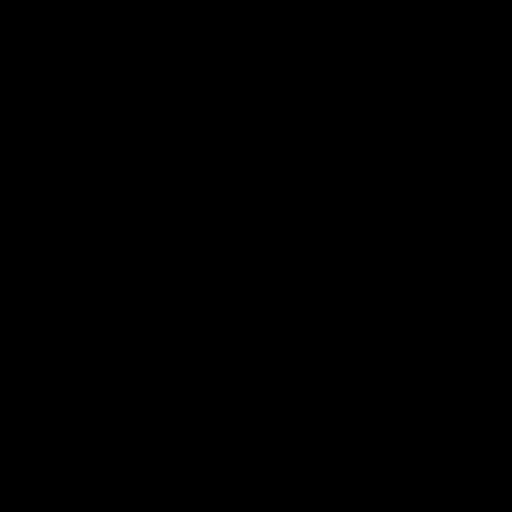
[im 69/69]
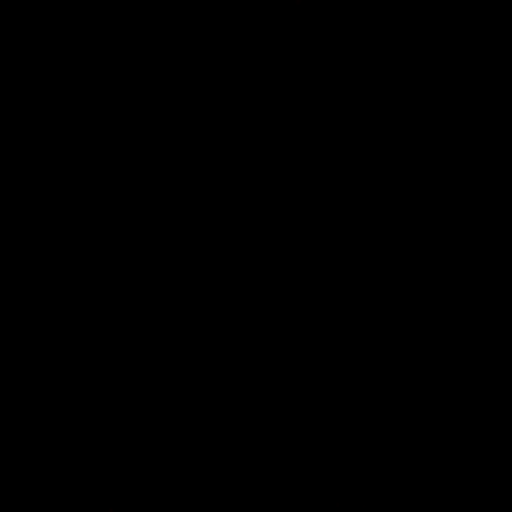

[Series 605: mip collection · coronal · 1.84mm/px · 1 of 32 slices shown]
[im 1/32]
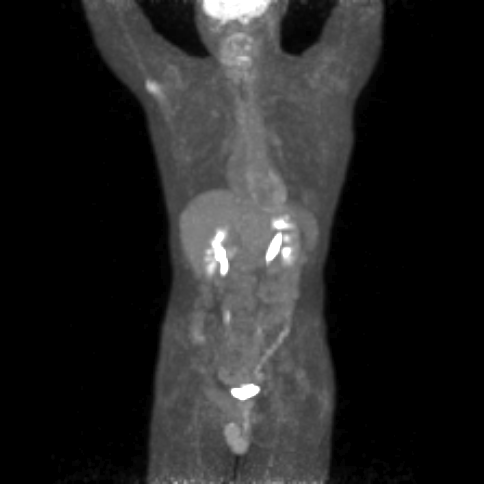

[Series 606: range-ct sk_thigh 5.0 (id)<alpha range> · 5 of 210 slices shown (2 of 2)]
[im 1/210]
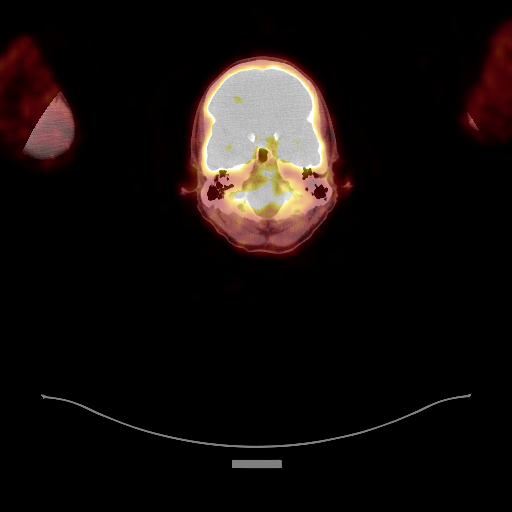
[im 53/210]
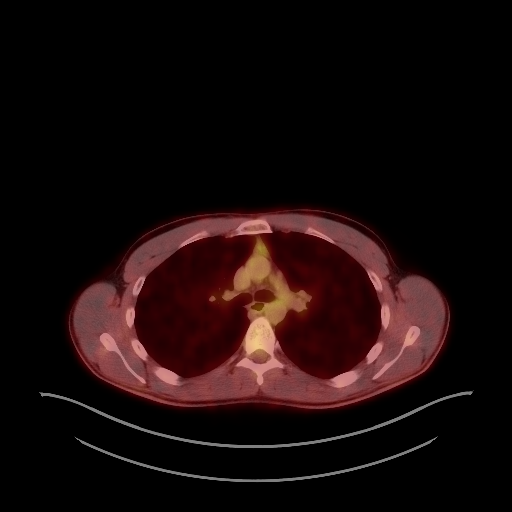
[im 105/210]
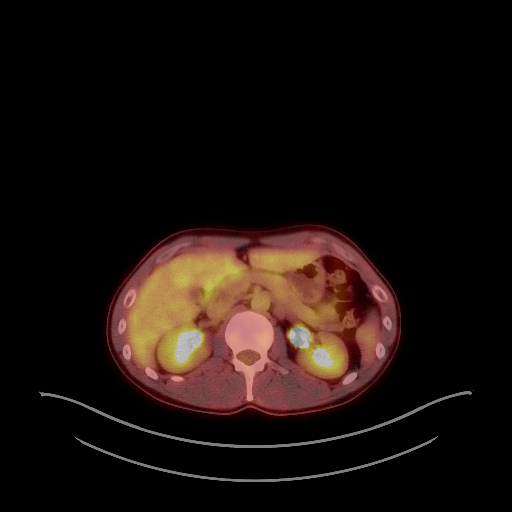
[im 157/210]
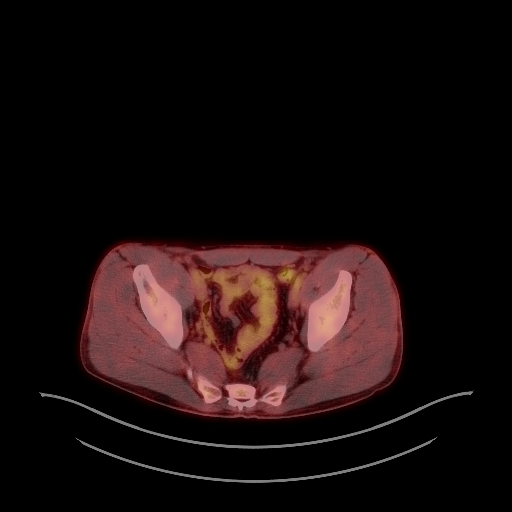
[im 210/210]
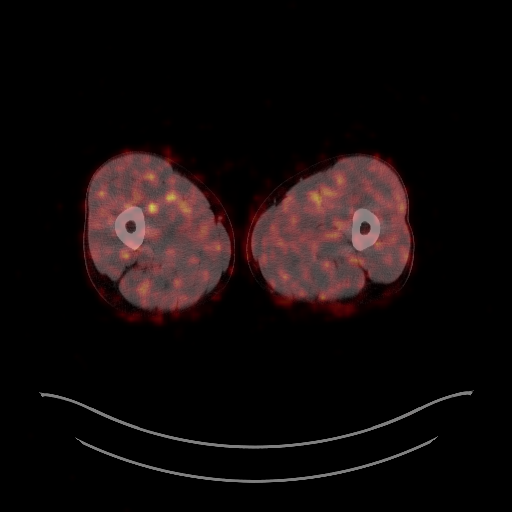

[Series 1077: results mm oncology reading · 0.68mm/px · 1 of 2 slices shown]
[im 1/2]
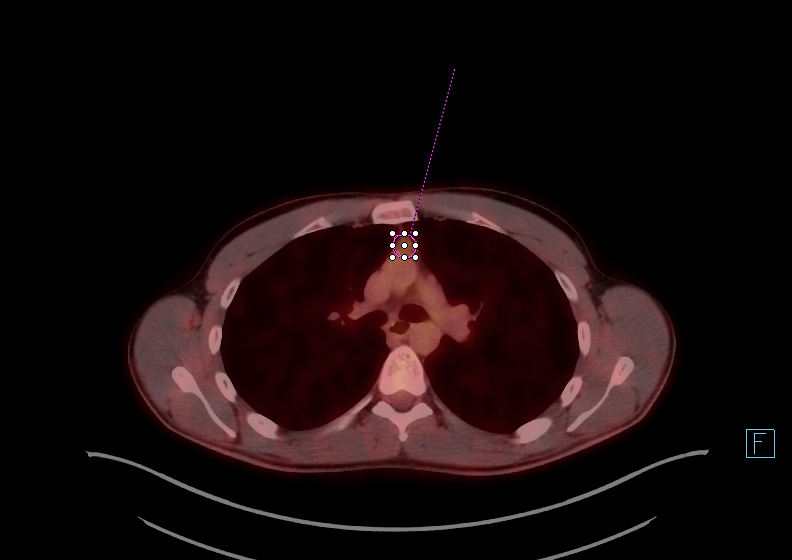

[25 of 25 positions shown; findings below may reference images not displayed]

FINDINGS: NECK

No residual metabolic activity within the RIGHT neck lymph nodes at
site of prior hypermetabolic lymphoma (PET-CT 08/24/2015)

CHEST

Minimal activity associated with small amount residual thymic tissue
in anterior mediastinum. This tissue is not changed by CT imaging.
There is mild metabolic activity associated with this tissue with
SUV max equal 3.0. This is increased from 2.2 on most recent FDG PET
scan. This activity is similar to background mediastinal activity.

No suspicious pulmonary nodules.

ABDOMEN/PELVIS

No abnormal metabolic activity lymph nodes the abdomen pelvis.
Normal spleen which is normal volume. No abnormal activity in the
liver.

SKELETON

No focal hypermetabolic activity to suggest skeletal metastasis.
IMPRESSION: 1. No residual hypermetabolic lymph nodes in the neck or mediastinum
( [HOSPITAL] 1)
2. Small amount of residual thymus in the anterior mediastinum with
activity equal to mediastinal activity ( [HOSPITAL] 2 ). Favored
differential of normal thymic tissue versus mild thymic rebound.
Residual lymphoma is not favored but recommend attention on
follow-up.

## 2017-12-23 ENCOUNTER — Encounter: Payer: Self-pay | Admitting: Hematology and Oncology

## 2017-12-23 ENCOUNTER — Inpatient Hospital Stay: Payer: 59 | Attending: Hematology and Oncology

## 2017-12-23 ENCOUNTER — Inpatient Hospital Stay (HOSPITAL_BASED_OUTPATIENT_CLINIC_OR_DEPARTMENT_OTHER): Payer: 59 | Admitting: Hematology and Oncology

## 2017-12-23 ENCOUNTER — Telehealth: Payer: Self-pay | Admitting: Hematology and Oncology

## 2017-12-23 VITALS — BP 110/71 | HR 72 | Temp 98.2°F | Resp 18 | Ht 72.0 in | Wt 143.0 lb

## 2017-12-23 DIAGNOSIS — Z8572 Personal history of non-Hodgkin lymphomas: Secondary | ICD-10-CM | POA: Diagnosis not present

## 2017-12-23 DIAGNOSIS — Z7982 Long term (current) use of aspirin: Secondary | ICD-10-CM | POA: Insufficient documentation

## 2017-12-23 DIAGNOSIS — I700 Atherosclerosis of aorta: Secondary | ICD-10-CM | POA: Diagnosis not present

## 2017-12-23 DIAGNOSIS — F17210 Nicotine dependence, cigarettes, uncomplicated: Secondary | ICD-10-CM | POA: Insufficient documentation

## 2017-12-23 DIAGNOSIS — Z86718 Personal history of other venous thrombosis and embolism: Secondary | ICD-10-CM | POA: Diagnosis not present

## 2017-12-23 DIAGNOSIS — Z9221 Personal history of antineoplastic chemotherapy: Secondary | ICD-10-CM | POA: Insufficient documentation

## 2017-12-23 DIAGNOSIS — Z23 Encounter for immunization: Secondary | ICD-10-CM | POA: Diagnosis not present

## 2017-12-23 DIAGNOSIS — Z720 Tobacco use: Secondary | ICD-10-CM

## 2017-12-23 DIAGNOSIS — C8118 Nodular sclerosis classical Hodgkin lymphoma, lymph nodes of multiple sites: Secondary | ICD-10-CM

## 2017-12-23 LAB — COMPREHENSIVE METABOLIC PANEL
ALK PHOS: 78 U/L (ref 38–126)
ALT: 12 U/L (ref 0–44)
ANION GAP: 7 (ref 5–15)
AST: 19 U/L (ref 15–41)
Albumin: 4.4 g/dL (ref 3.5–5.0)
BUN: 8 mg/dL (ref 6–20)
CALCIUM: 9.3 mg/dL (ref 8.9–10.3)
CHLORIDE: 103 mmol/L (ref 98–111)
CO2: 28 mmol/L (ref 22–32)
CREATININE: 0.92 mg/dL (ref 0.61–1.24)
GFR calc Af Amer: >60 mL/min (ref >60)
Glucose, Bld: 88 mg/dL (ref 70–99)
Potassium: 4.3 mmol/L (ref 3.5–5.1)
Sodium: 138 mmol/L (ref 135–145)
Total Bilirubin: 0.6 mg/dL (ref 0.3–1.2)
Total Protein: 7.4 g/dL (ref 6.5–8.1)

## 2017-12-23 LAB — CBC WITH DIFFERENTIAL/PLATELET
ABS IMMATURE GRANULOCYTES: 0.01 10*3/uL (ref 0.00–0.07)
BASOS PCT: 1 %
Basophils Absolute: 0.1 10*3/uL (ref 0.0–0.1)
EOS PCT: 11 %
Eosinophils Absolute: 0.7 10*3/uL — ABNORMAL HIGH (ref 0.0–0.5)
HCT: 44.2 % (ref 39.0–52.0)
Hemoglobin: 15.2 g/dL (ref 13.0–17.0)
Immature Granulocytes: 0 %
Lymphocytes Relative: 30 %
Lymphs Abs: 1.8 10*3/uL (ref 0.7–4.0)
MCH: 31.9 pg (ref 26.0–34.0)
MCHC: 34.4 g/dL (ref 30.0–36.0)
MCV: 92.7 fL (ref 80.0–100.0)
MONO ABS: 0.6 10*3/uL (ref 0.1–1.0)
MONOS PCT: 9 %
Neutro Abs: 2.9 10*3/uL (ref 1.7–7.7)
Neutrophils Relative %: 49 %
PLATELETS: 232 10*3/uL (ref 150–400)
RBC: 4.77 MIL/uL (ref 4.22–5.81)
RDW: 12.5 % (ref 11.5–15.5)
WBC: 6 10*3/uL (ref 4.0–10.5)
nRBC: 0 % (ref 0.0–0.2)

## 2017-12-23 LAB — LACTATE DEHYDROGENASE: LDH: 164 U/L (ref 98–192)

## 2017-12-23 MED ORDER — INFLUENZA VAC SPLIT QUAD 0.5 ML IM SUSY
0.5000 mL | PREFILLED_SYRINGE | Freq: Once | INTRAMUSCULAR | Status: AC
  Administered 2017-12-23: 0.5 mL via INTRAMUSCULAR

## 2017-12-23 MED ORDER — INFLUENZA VAC SPLIT QUAD 0.5 ML IM SUSY
PREFILLED_SYRINGE | INTRAMUSCULAR | Status: AC
  Filled 2017-12-23: qty 0.5

## 2017-12-23 NOTE — Progress Notes (Signed)
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 He does not have any prior allergic reactions to influenza vaccination. He agrees to proceed with influenza vaccination today and we will administer it today at the clinic.   History of DVT (deep vein thrombosis) The patient had factor V Leiden mutation and prior history of thrombosis He is currently on 325 mg aspirin for secondary prevention He is counseled to stop smoking  Tobacco abuse He is attempting to quit smoking We discussed the importance of nicotine cessation   No orders of the defined types were placed in this encounter.   INTERVAL HISTORY: Please see below for problem oriented charting. 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    07/28/2015 Pathology Results    Accession: [id number] core  needle biopsy of the right supraclavicular region came back positive for classical Hodgkin lymphoma    08/22/2015 Bone Marrow Biopsy    Accession: [id number] Bone marrow is negative for involvement    08/22/2015 Procedure    He has port placement and bone marrow biopsy    08/23/2015 Imaging    ECHO showed normal EF    08/24/2015 PET scan    Bilateral hypermetabolic supraclavicular lymphadenopathy and mediastinal adenopathy consistent with known Hodgkin's lymphoma. 2. No disease is identified below the diaphragms.    08/25/2015 -  Chemotherapy    He is started on ABVD    09/19/2015 Imaging    Superficial thrombosis is noted in the left greater saphenous vein, from distal thigh extending up to groin. Non-occlusive DVT is extending into the left common femoral vein, at the   saphenofemoral junction.    09/22/2015 Adverse Reaction    Due to cough, Bleomycin is placed on hold    10/14/2015 PET scan    Marked reduction in size of the the thoracic and supraclavicular adenopathy, with interval resolution of the prior hypermetabolic activity indicating response to effective therapy. No significant residual hypermetabolic adenopathy.    03/28/2016 Procedure    Successful removal of implanted Port-A-Cath.    05/18/2016 PET scan    No residual hypermetabolic lymph nodes in the neck or mediastinum ( [place] 1). Small amount of residual thymus in the anterior mediastinum with activity equal to mediastinal activity ( [place] 2 ). Favored differential of normal thymic tissue versus mild thymic rebound. Residual lymphoma is not favored but recommend attention on follow-up.    10/10/2016 PET scan    1. No  evidence for hypermetabolic lymphadenopathy in the neck or chest on today's study ([place] 1). 2. No change in the minimal FDG uptake identified in the thymic remnant on today's study ([place] 2). This is probably physiologic and most likely unrelated to lymphoma    04/18/2017 Imaging    1. No  evidence of active lymphoma or acute process in the chest, abdomen, or pelvis. 2. Similar residual thymic tissue in the anterior mediastinum, within normal variation in this age group. 3. Aortic Atherosclerosis (ICD10-I70.0). This is markedly age advanced.     REVIEW OF SYSTEMS:   Constitutional: Denies fevers, chills or abnormal weight loss Eyes: Denies blurriness of vision Ears, nose, mouth, throat, and face: Denies mucositis or sore throat Respiratory: Denies cough, dyspnea or wheezes Cardiovascular: Denies palpitation, chest discomfort or lower extremity swelling Gastrointestinal:  Denies nausea, heartburn or change in bowel habits Skin: Denies abnormal skin rashes Lymphatics: Denies new lymphadenopathy or easy bruising Neurological:Denies numbness, tingling or new weaknesses Behavioral/Psych: Mood is stable, no new changes  All other systems were reviewed with the patient and are negative.  I have reviewed the past medical history, past surgical history, social history and family history with the patient and they are unchanged from previous note.  ALLERGIES:  has No Known Allergies.  MEDICATIONS:  Current Outpatient Medications  Medication Sig Dispense Refill  . aspirin 325 MG tablet Take 325 mg by mouth daily.    Marland Kitchen ibuprofen (ADVIL,MOTRIN) 200 MG tablet Take 400-600 mg by mouth every 6 (six) hours as needed for headache.    . Multiple Vitamin (MULTIVITAMIN) tablet Take 1 tablet by mouth daily.     No current facility-administered medications for this visit.     PHYSICAL EXAMINATION: ECOG PERFORMANCE STATUS: 0 - Asymptomatic  Vitals:   12/23/17 1208  BP: 110/71  Pulse: 72  Resp: 18  Temp: 98.2 F (36.8 C)  SpO2: 100%   Filed Weights   12/23/17 1208  Weight: 143 lb (64.9 kg)    GENERAL:alert, no distress and comfortable SKIN: skin color, texture, turgor are normal, no rashes or significant lesions EYES: normal, Conjunctiva are pink and non-injected, sclera  clear OROPHARYNX:no exudate, no erythema and lips, buccal mucosa, and tongue normal  NECK: supple, thyroid normal size, non-tender, without nodularity LYMPH:  no palpable lymphadenopathy in the cervical, axillary or inguinal LUNGS: clear to auscultation and percussion with normal breathing effort HEART: regular rate & rhythm and no murmurs and no lower extremity edema ABDOMEN:abdomen soft, non-tender and normal bowel sounds Musculoskeletal:no cyanosis of digits and no clubbing  NEURO: alert & oriented x 3 with fluent speech, no focal motor/sensory deficits  LABORATORY DATA:  I have reviewed the data as listed    Component Value Date/Time   NA 138 12/23/2017 1147   NA 140 05/16/2017 1346   NA 139 01/10/2017 0908   K 4.3 12/23/2017 1147   K 3.8 01/10/2017 0908   CL 103 12/23/2017 1147   CO2 28 12/23/2017 1147   CO2 27 01/10/2017 0908   GLUCOSE 88 12/23/2017 1147   GLUCOSE 71 01/10/2017 0908   BUN 8 12/23/2017 1147   BUN 7 05/16/2017 1346   BUN 7.3 01/10/2017 0908   CREATININE 0.92 12/23/2017 1147   CREATININE 0.9 01/10/2017 0908   CALCIUM 9.3 12/23/2017 1147   CALCIUM 9.4 01/10/2017 0908   PROT 7.4 12/23/2017 1147   PROT 6.8 05/16/2017 1346   PROT 7.3 01/10/2017 0908   ALBUMIN 4.4 12/23/2017 1147   ALBUMIN 4.6 05/16/2017 1346  ALBUMIN 4.2 01/10/2017 0908   AST 19 12/23/2017 1147   AST 19 01/10/2017 0908   ALT 12 12/23/2017 1147   ALT 14 01/10/2017 0908   ALKPHOS 78 12/23/2017 1147   ALKPHOS 74 01/10/2017 0908   BILITOT 0.6 12/23/2017 1147   BILITOT 0.3 05/16/2017 1346   BILITOT 0.65 01/10/2017 0908   GFRNONAA >60 12/23/2017 1147   GFRAA >60 12/23/2017 1147    No results found for: SPEP, UPEP  Lab Results  Component Value Date   WBC 6.0 12/23/2017   NEUTROABS 2.9 12/23/2017   HGB 15.2 12/23/2017   HCT 44.2 12/23/2017   MCV 92.7 12/23/2017   PLT 232 12/23/2017      Chemistry      Component Value Date/Time   NA 138 12/23/2017 1147   NA 140 05/16/2017 1346    NA 139 01/10/2017 0908   K 4.3 12/23/2017 1147   K 3.8 01/10/2017 0908   CL 103 12/23/2017 1147   CO2 28 12/23/2017 1147   CO2 27 01/10/2017 0908   BUN 8 12/23/2017 1147   BUN 7 05/16/2017 1346   BUN 7.3 01/10/2017 0908   CREATININE 0.92 12/23/2017 1147   CREATININE 0.9 01/10/2017 0908      Component Value Date/Time   CALCIUM 9.3 12/23/2017 1147   CALCIUM 9.4 01/10/2017 0908   ALKPHOS 78 12/23/2017 1147   ALKPHOS 74 01/10/2017 0908   AST 19 12/23/2017 1147   AST 19 01/10/2017 0908   ALT 12 12/23/2017 1147   ALT 14 01/10/2017 0908   BILITOT 0.6 12/23/2017 1147   BILITOT 0.3 05/16/2017 1346   BILITOT 0.65 01/10/2017 0908       All questions were answered. The patient knows to call the clinic with any problems, questions or concerns. No barriers to learning was detected.  I spent 15 minutes counseling the patient face to face. The total time spent in the appointment was 20 minutes and more than 50% was on counseling and review of test results  Heath Lark, MD 12/23/2017 12:55 PM

## 2017-12-23 NOTE — Assessment & Plan Note (Signed)
The patient had factor V Leiden mutation and prior history of thrombosis He is currently on 325 mg aspirin for secondary prevention He is counseled to stop smoking 

## 2017-12-23 NOTE — Assessment & Plan Note (Signed)
Clinically, he has no signs or symptoms of cancer recurrence I plan to follow with history, physical examination and blood work only I will defer imaging study until next year I will see him back in 9 months for further follow-up We discussed the importance of preventive care and reviewed the vaccination programs. He does not have any prior allergic reactions to influenza vaccination. He agrees to proceed with influenza vaccination today and we will administer it today at the clinic.

## 2017-12-23 NOTE — Telephone Encounter (Signed)
Per 10/28 los.  Called patient to make 9 month appt for 7/31 @ 2:45 pm.

## 2017-12-23 NOTE — Assessment & Plan Note (Signed)
He is attempting to quit smoking We discussed the importance of nicotine cessation 

## 2018-09-25 ENCOUNTER — Other Ambulatory Visit: Payer: Self-pay | Admitting: Hematology and Oncology

## 2018-09-25 DIAGNOSIS — C8118 Nodular sclerosis classical Hodgkin lymphoma, lymph nodes of multiple sites: Secondary | ICD-10-CM

## 2018-09-26 ENCOUNTER — Other Ambulatory Visit: Payer: 59

## 2018-09-26 ENCOUNTER — Other Ambulatory Visit: Payer: Self-pay

## 2018-09-26 ENCOUNTER — Inpatient Hospital Stay: Payer: Self-pay | Attending: Hematology and Oncology

## 2018-09-26 ENCOUNTER — Telehealth: Payer: Self-pay | Admitting: Hematology and Oncology

## 2018-09-26 ENCOUNTER — Encounter: Payer: Self-pay | Admitting: Hematology and Oncology

## 2018-09-26 ENCOUNTER — Inpatient Hospital Stay (HOSPITAL_BASED_OUTPATIENT_CLINIC_OR_DEPARTMENT_OTHER): Payer: 59 | Admitting: Hematology and Oncology

## 2018-09-26 ENCOUNTER — Ambulatory Visit: Payer: 59 | Admitting: Hematology and Oncology

## 2018-09-26 DIAGNOSIS — Z720 Tobacco use: Secondary | ICD-10-CM

## 2018-09-26 DIAGNOSIS — Z9221 Personal history of antineoplastic chemotherapy: Secondary | ICD-10-CM | POA: Insufficient documentation

## 2018-09-26 DIAGNOSIS — I700 Atherosclerosis of aorta: Secondary | ICD-10-CM | POA: Insufficient documentation

## 2018-09-26 DIAGNOSIS — Z7982 Long term (current) use of aspirin: Secondary | ICD-10-CM

## 2018-09-26 DIAGNOSIS — C8118 Nodular sclerosis classical Hodgkin lymphoma, lymph nodes of multiple sites: Secondary | ICD-10-CM | POA: Insufficient documentation

## 2018-09-26 DIAGNOSIS — D6851 Activated protein C resistance: Secondary | ICD-10-CM | POA: Insufficient documentation

## 2018-09-26 DIAGNOSIS — F17210 Nicotine dependence, cigarettes, uncomplicated: Secondary | ICD-10-CM

## 2018-09-26 LAB — LACTATE DEHYDROGENASE: LDH: 161 U/L (ref 98–192)

## 2018-09-26 LAB — CBC WITH DIFFERENTIAL/PLATELET
Abs Immature Granulocytes: 0.01 10*3/uL (ref 0.00–0.07)
Basophils Absolute: 0.1 10*3/uL (ref 0.0–0.1)
Basophils Relative: 1 %
Eosinophils Absolute: 0.6 10*3/uL — ABNORMAL HIGH (ref 0.0–0.5)
Eosinophils Relative: 11 %
HCT: 40 % (ref 39.0–52.0)
Hemoglobin: 14.1 g/dL (ref 13.0–17.0)
Immature Granulocytes: 0 %
Lymphocytes Relative: 31 %
Lymphs Abs: 1.8 10*3/uL (ref 0.7–4.0)
MCH: 31.8 pg (ref 26.0–34.0)
MCHC: 35.3 g/dL (ref 30.0–36.0)
MCV: 90.3 fL (ref 80.0–100.0)
Monocytes Absolute: 0.5 10*3/uL (ref 0.1–1.0)
Monocytes Relative: 9 %
Neutro Abs: 2.8 10*3/uL (ref 1.7–7.7)
Neutrophils Relative %: 48 %
Platelets: 223 10*3/uL (ref 150–400)
RBC: 4.43 MIL/uL (ref 4.22–5.81)
RDW: 12.3 % (ref 11.5–15.5)
WBC: 5.8 10*3/uL (ref 4.0–10.5)
nRBC: 0 % (ref 0.0–0.2)

## 2018-09-26 LAB — COMPREHENSIVE METABOLIC PANEL
ALT: 13 U/L (ref 0–44)
AST: 18 U/L (ref 15–41)
Albumin: 4.3 g/dL (ref 3.5–5.0)
Alkaline Phosphatase: 67 U/L (ref 38–126)
Anion gap: 8 (ref 5–15)
BUN: 8 mg/dL (ref 6–20)
CO2: 26 mmol/L (ref 22–32)
Calcium: 9.1 mg/dL (ref 8.9–10.3)
Chloride: 106 mmol/L (ref 98–111)
Creatinine, Ser: 1 mg/dL (ref 0.61–1.24)
GFR calc Af Amer: >60 mL/min (ref >60)
GFR calc non Af Amer: >60 mL/min (ref >60)
Glucose, Bld: 89 mg/dL (ref 70–99)
Potassium: 4 mmol/L (ref 3.5–5.1)
Sodium: 140 mmol/L (ref 135–145)
Total Bilirubin: 0.5 mg/dL (ref 0.3–1.2)
Total Protein: 6.7 g/dL (ref 6.5–8.1)

## 2018-09-26 NOTE — Assessment & Plan Note (Signed)
He is attempting to quit smoking We discussed the importance of nicotine cessation

## 2018-09-26 NOTE — Assessment & Plan Note (Signed)
Clinically, he has no signs or symptoms of cancer recurrence I plan to follow with history, physical examination and blood work only I will defer imaging study unless he has signs or symptoms of cancer recurrence I will see him back in a year We discussed the importance of influenza vaccination

## 2018-09-26 NOTE — Assessment & Plan Note (Signed)
The patient had factor V Leiden mutation and prior history of thrombosis He is currently on 325 mg aspirin for secondary prevention He is counseled to stop smoking

## 2018-09-26 NOTE — Telephone Encounter (Signed)
I talk with patient regarding schedule  

## 2018-09-26 NOTE — Progress Notes (Signed)
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 ([place]) Clinically, he has no signs or symptoms of cancer recurrence I plan to follow with history, physical examination and blood work only I will defer imaging study unless he has signs or symptoms of cancer recurrence I will see him back in a year We discussed the importance of influenza vaccination  Factor V Leiden ([place]) The patient had factor V Leiden mutation and prior history of thrombosis He is currently on 325 mg aspirin for secondary prevention He is counseled to stop smoking  Tobacco abuse He is attempting to quit smoking We discussed the importance of nicotine cessation   Orders Placed This Encounter  Procedures  . Comprehensive metabolic panel    Standing Status:   Future    Standing Expiration Date:   10/31/2019  . CBC with Differential/Platelet    Standing Status:   Future    Standing Expiration Date:   10/31/2019  . Lactate dehydrogenase    Standing Status:   Future    Standing Expiration Date:   09/26/2019    INTERVAL HISTORY: Please see below for problem oriented charting. 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   07/28/2015 Pathology Results   Accession: [id number] core needle biopsy of the right supraclavicular region came back positive for classical Hodgkin lymphoma   08/22/2015 Bone Marrow Biopsy   Accession: [id number] Bone marrow is negative for involvement   08/22/2015 Procedure   He has port placement and bone marrow biopsy   08/23/2015 Imaging   ECHO showed normal EF   08/24/2015 PET scan   Bilateral hypermetabolic supraclavicular lymphadenopathy and mediastinal adenopathy consistent with known Hodgkin's lymphoma. 2. No disease is identified below the diaphragms.   08/25/2015 -  Chemotherapy   He is started on ABVD   09/19/2015 Imaging   Superficial thrombosis is noted in the left greater saphenous vein, from distal thigh extending up to groin. Non-occlusive DVT is extending into the left common femoral vein, at the   saphenofemoral junction.   09/22/2015 Adverse Reaction   Due to cough, Bleomycin is placed on hold   10/14/2015 PET scan   Marked reduction in size of the the thoracic and supraclavicular adenopathy, with interval resolution of the prior hypermetabolic activity indicating response to effective therapy. No significant residual hypermetabolic adenopathy.   03/28/2016 Procedure   Successful removal of implanted Port-A-Cath.   05/18/2016 PET scan   No residual hypermetabolic lymph nodes in the neck or mediastinum ( [place] 1). Small amount of residual thymus in the anterior mediastinum with activity equal to mediastinal activity ( [place] 2 ). Favored differential of normal thymic tissue versus mild thymic rebound. Residual lymphoma is not favored but recommend attention on follow-up.   10/10/2016 PET scan   1. No  evidence for hypermetabolic lymphadenopathy in the neck or chest on today's study ([place] 1). 2. No change in the minimal FDG uptake identified in the thymic remnant on today's study ([place] 2). This is probably physiologic and most  likely unrelated to lymphoma   04/18/2017 Imaging   1. No evidence of active lymphoma or acute process in the chest, abdomen, or pelvis. 2. Similar residual thymic tissue in the anterior mediastinum, within normal variation in this age group. 3. Aortic Atherosclerosis (ICD10-I70.0). This is markedly age advanced.     REVIEW OF SYSTEMS:   Constitutional: Denies fevers, chills or abnormal weight loss Eyes: Denies blurriness of vision Ears, nose, mouth, throat, and face: Denies mucositis or sore throat Respiratory: Denies cough, dyspnea or wheezes Cardiovascular: Denies palpitation, chest discomfort or lower extremity swelling Gastrointestinal:  Denies nausea, heartburn or change in bowel habits Skin: Denies abnormal skin rashes Lymphatics: Denies new lymphadenopathy or easy bruising Neurological:Denies numbness, tingling or new weaknesses Behavioral/Psych: Mood is stable, no new changes  All other systems were reviewed with the patient and are negative.  I have reviewed the past medical history, past surgical history, social history and family history with the patient and they are unchanged from previous note.  ALLERGIES:  has No Known Allergies.  MEDICATIONS:  Current Outpatient Medications  Medication Sig Dispense Refill  . aspirin 325 MG tablet Take 325 mg by mouth daily.    Marland Kitchen ibuprofen (ADVIL,MOTRIN) 200 MG tablet Take 400-600 mg by mouth every 6 (six) hours as needed for headache.    . Multiple Vitamin (MULTIVITAMIN) tablet Take 1 tablet by mouth daily.     No current facility-administered medications for this visit.     PHYSICAL EXAMINATION: ECOG PERFORMANCE STATUS: 0 - Asymptomatic  Vitals:   09/26/18 1235  BP: 113/75  Pulse: 70  Resp: 20  Temp: 98.3 F (36.8 C)  SpO2: 99%   Filed Weights   09/26/18 1235  Weight: 143 lb 9.6 oz (65.1 kg)    GENERAL:alert, no distress and comfortable SKIN: skin color, texture, turgor are normal, no rashes or significant  lesions EYES: normal, Conjunctiva are pink and non-injected, sclera clear OROPHARYNX:no exudate, no erythema and lips, buccal mucosa, and tongue normal  NECK: supple, thyroid normal size, non-tender, without nodularity LYMPH:  no palpable lymphadenopathy in the cervical, axillary or inguinal LUNGS: clear to auscultation and percussion with normal breathing effort HEART: regular rate & rhythm and no murmurs and no lower extremity edema ABDOMEN:abdomen soft, non-tender and normal bowel sounds Musculoskeletal:no cyanosis of digits and no clubbing  NEURO: alert & oriented x 3 with fluent speech, no focal motor/sensory deficits  LABORATORY DATA:  I have reviewed the data as listed    Component Value Date/Time   NA 140 09/26/2018 1223   NA 140 05/16/2017 1346   NA 139 01/10/2017 0908   K 4.0 09/26/2018 1223   K 3.8 01/10/2017 0908   CL 106 09/26/2018 1223   CO2 26 09/26/2018 1223   CO2 27 01/10/2017 0908   GLUCOSE 89 09/26/2018 1223   GLUCOSE 71 01/10/2017 0908   BUN 8 09/26/2018 1223   BUN 7 05/16/2017 1346   BUN 7.3 01/10/2017 0908   CREATININE 1.00 09/26/2018 1223   CREATININE 0.9 01/10/2017 0908   CALCIUM 9.1 09/26/2018 1223   CALCIUM 9.4 01/10/2017 0908   PROT 6.7 09/26/2018 1223   PROT 6.8 05/16/2017 1346   PROT 7.3 01/10/2017 0908   ALBUMIN 4.3 09/26/2018 1223   ALBUMIN 4.6 05/16/2017 1346  ALBUMIN 4.2 01/10/2017 0908   AST 18 09/26/2018 1223   AST 19 01/10/2017 0908   ALT 13 09/26/2018 1223   ALT 14 01/10/2017 0908   ALKPHOS 67 09/26/2018 1223   ALKPHOS 74 01/10/2017 0908   BILITOT 0.5 09/26/2018 1223   BILITOT 0.3 05/16/2017 1346   BILITOT 0.65 01/10/2017 0908   GFRNONAA >60 09/26/2018 1223   GFRAA >60 09/26/2018 1223    No results found for: SPEP, UPEP  Lab Results  Component Value Date   WBC 5.8 09/26/2018   NEUTROABS 2.8 09/26/2018   HGB 14.1 09/26/2018   HCT 40.0 09/26/2018   MCV 90.3 09/26/2018   PLT 223 09/26/2018      Chemistry       Component Value Date/Time   NA 140 09/26/2018 1223   NA 140 05/16/2017 1346   NA 139 01/10/2017 0908   K 4.0 09/26/2018 1223   K 3.8 01/10/2017 0908   CL 106 09/26/2018 1223   CO2 26 09/26/2018 1223   CO2 27 01/10/2017 0908   BUN 8 09/26/2018 1223   BUN 7 05/16/2017 1346   BUN 7.3 01/10/2017 0908   CREATININE 1.00 09/26/2018 1223   CREATININE 0.9 01/10/2017 0908      Component Value Date/Time   CALCIUM 9.1 09/26/2018 1223   CALCIUM 9.4 01/10/2017 0908   ALKPHOS 67 09/26/2018 1223   ALKPHOS 74 01/10/2017 0908   AST 18 09/26/2018 1223   AST 19 01/10/2017 0908   ALT 13 09/26/2018 1223   ALT 14 01/10/2017 0908   BILITOT 0.5 09/26/2018 1223   BILITOT 0.3 05/16/2017 1346   BILITOT 0.65 01/10/2017 0908      All questions were answered. The patient knows to call the clinic with any problems, questions or concerns. No barriers to learning was detected.  I spent 10 minutes counseling the patient face to face. The total time spent in the appointment was 15 minutes and more than 50% was on counseling and review of test results  Heath Lark, MD 09/26/2018 2:15 PM

## 2019-01-28 ENCOUNTER — Telehealth: Payer: Self-pay | Admitting: *Deleted

## 2019-01-28 NOTE — Telephone Encounter (Signed)
Telephone call to patient- LM for return call. Need additional information about his developing rash. Will schedule appointment if necessary.

## 2019-01-28 NOTE — Telephone Encounter (Signed)
-----   Message from [facility] sent at 01/28/2019 12:35 PM EST ----- Regarding: Follow-up request. Hello!  Patient has a rash that developed on his neck and would to like schedule a follow-up with Dr. Alvy Bimler for her next availability, ok to schedule?  Thank you, Rise Paganini.

## 2019-01-29 ENCOUNTER — Encounter: Payer: Self-pay | Admitting: Hematology and Oncology

## 2019-01-29 ENCOUNTER — Inpatient Hospital Stay
Payer: No Typology Code available for payment source | Attending: Hematology and Oncology | Admitting: Hematology and Oncology

## 2019-01-29 ENCOUNTER — Inpatient Hospital Stay: Payer: No Typology Code available for payment source

## 2019-01-29 ENCOUNTER — Other Ambulatory Visit: Payer: Self-pay

## 2019-01-29 ENCOUNTER — Ambulatory Visit (HOSPITAL_COMMUNITY)
Admission: RE | Admit: 2019-01-29 | Discharge: 2019-01-29 | Disposition: A | Discharge status: Home / Self Care | Payer: Self-pay | Source: Ambulatory Visit | Attending: Hematology and Oncology | Admitting: Hematology and Oncology

## 2019-01-29 VITALS — BP 115/81 | HR 80 | Temp 97.8°F | Resp 18 | Ht 72.0 in | Wt 143.4 lb

## 2019-01-29 DIAGNOSIS — Z791 Long term (current) use of non-steroidal anti-inflammatories (NSAID): Secondary | ICD-10-CM | POA: Diagnosis not present

## 2019-01-29 DIAGNOSIS — F17210 Nicotine dependence, cigarettes, uncomplicated: Secondary | ICD-10-CM | POA: Diagnosis not present

## 2019-01-29 DIAGNOSIS — Z86718 Personal history of other venous thrombosis and embolism: Secondary | ICD-10-CM | POA: Diagnosis not present

## 2019-01-29 DIAGNOSIS — Z7982 Long term (current) use of aspirin: Secondary | ICD-10-CM | POA: Insufficient documentation

## 2019-01-29 DIAGNOSIS — C8118 Nodular sclerosis classical Hodgkin lymphoma, lymph nodes of multiple sites: Secondary | ICD-10-CM

## 2019-01-29 DIAGNOSIS — T7840XD Allergy, unspecified, subsequent encounter: Secondary | ICD-10-CM

## 2019-01-29 DIAGNOSIS — Z9221 Personal history of antineoplastic chemotherapy: Secondary | ICD-10-CM | POA: Insufficient documentation

## 2019-01-29 DIAGNOSIS — Z720 Tobacco use: Secondary | ICD-10-CM

## 2019-01-29 LAB — CBC WITH DIFFERENTIAL/PLATELET
Abs Immature Granulocytes: 0.01 10*3/uL (ref 0.00–0.07)
Basophils Absolute: 0.1 10*3/uL (ref 0.0–0.1)
Basophils Relative: 1 %
Eosinophils Absolute: 0.4 10*3/uL (ref 0.0–0.5)
Eosinophils Relative: 7 %
HCT: 43.6 % (ref 39.0–52.0)
Hemoglobin: 15 g/dL (ref 13.0–17.0)
Immature Granulocytes: 0 %
Lymphocytes Relative: 24 %
Lymphs Abs: 1.4 10*3/uL (ref 0.7–4.0)
MCH: 31.6 pg (ref 26.0–34.0)
MCHC: 34.4 g/dL (ref 30.0–36.0)
MCV: 91.8 fL (ref 80.0–100.0)
Monocytes Absolute: 0.6 10*3/uL (ref 0.1–1.0)
Monocytes Relative: 11 %
Neutro Abs: 3.3 10*3/uL (ref 1.7–7.7)
Neutrophils Relative %: 57 %
Platelets: 215 10*3/uL (ref 150–400)
RBC: 4.75 MIL/uL (ref 4.22–5.81)
RDW: 12.4 % (ref 11.5–15.5)
WBC: 5.7 10*3/uL (ref 4.0–10.5)
nRBC: 0 % (ref 0.0–0.2)

## 2019-01-29 LAB — COMPREHENSIVE METABOLIC PANEL
ALT: 14 U/L (ref 0–44)
AST: 19 U/L (ref 15–41)
Albumin: 4.7 g/dL (ref 3.5–5.0)
Alkaline Phosphatase: 76 U/L (ref 38–126)
Anion gap: 9 (ref 5–15)
BUN: 8 mg/dL (ref 6–20)
CO2: 28 mmol/L (ref 22–32)
Calcium: 9.1 mg/dL (ref 8.9–10.3)
Chloride: 104 mmol/L (ref 98–111)
Creatinine, Ser: 0.9 mg/dL (ref 0.61–1.24)
GFR calc Af Amer: >60 mL/min (ref >60)
GFR calc non Af Amer: >60 mL/min (ref >60)
Glucose, Bld: 92 mg/dL (ref 70–99)
Potassium: 4.4 mmol/L (ref 3.5–5.1)
Sodium: 141 mmol/L (ref 135–145)
Total Bilirubin: 0.4 mg/dL (ref 0.3–1.2)
Total Protein: 7.3 g/dL (ref 6.5–8.1)

## 2019-01-29 LAB — LACTATE DEHYDROGENASE: LDH: 175 U/L (ref 98–192)

## 2019-01-29 LAB — SEDIMENTATION RATE: Sed Rate: 2 mm/hr (ref 0–16)

## 2019-01-29 NOTE — Assessment & Plan Note (Signed)
He has palpable small lymphadenopathy on both sides of the neck I will order blood work and chest x-ray for baseline evaluation If the tests are unrevealing, I plan to order PET/CT imaging to rule out cancer recurrence

## 2019-01-29 NOTE — Assessment & Plan Note (Signed)
He has a history of DVTs Unfortunately he continues to smoke He has chest discomfort I will order x-ray for further evaluation

## 2019-01-29 NOTE — Assessment & Plan Note (Signed)
He has itching of the skin Evaluation revealed evidence of skin erythema around the back of his neck, most consistent with some form of dermatitis I will order baseline blood work for evaluation If that is unrevealing, I would need to order CT imaging to rule out cancer recurrence

## 2019-01-29 NOTE — Progress Notes (Signed)
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 DG Chest 2 View    Standing Status:   Future    Number of Occurrences:   1    Standing Expiration Date:   03/04/2020    Order Specific Question:   Reason for exam:    Answer:   chest pain, Hx lymphoma and DVT    Order Specific Question:   Preferred imaging location?    Answer:   [facility]  . CBC with Differential    Standing Status:   Future    Number of Occurrences:   1    Standing Expiration Date:   03/04/2020  . Comprehensive metabolic panel    Standing Status:   Future    Number of Occurrences:   1    Standing Expiration Date:   03/04/2020  . Lactate dehydrogenase    Standing Status:   Future    Number of Occurrences:   1    Standing Expiration Date:   01/29/2020  . Sedimentation rate    Standing Status:   Future    Number of Occurrences:   1    Standing Expiration Date:   03/04/2020    INTERVAL HISTORY: Please see below for problem oriented charting. 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   07/28/2015 Pathology Results   Accession: [id number] core needle biopsy of the right supraclavicular region came back positive for classical Hodgkin lymphoma   08/22/2015 Bone Marrow Biopsy   Accession: [id number] Bone marrow is negative for involvement   08/22/2015 Procedure   He has port placement and bone marrow biopsy   08/23/2015 Imaging   ECHO showed normal EF   08/24/2015 PET scan   Bilateral hypermetabolic supraclavicular lymphadenopathy and mediastinal adenopathy consistent with known Hodgkin's  lymphoma. 2. No disease is identified below the diaphragms.   08/25/2015 -  Chemotherapy   He is started on ABVD   09/19/2015 Imaging   Superficial thrombosis is noted in the left greater saphenous vein, from distal thigh extending up to groin. Non-occlusive DVT is extending into the left common femoral vein, at the   saphenofemoral junction.   09/22/2015 Adverse Reaction   Due to cough, Bleomycin is placed on hold   10/14/2015 PET scan   Marked reduction in size of the the thoracic and supraclavicular adenopathy, with interval resolution of the prior hypermetabolic  activity indicating response to effective therapy. No significant residual hypermetabolic adenopathy.   03/28/2016 Procedure   Successful removal of implanted Port-A-Cath.   05/18/2016 PET scan   No residual hypermetabolic lymph nodes in the neck or mediastinum ( Deauville 1). Small amount of residual thymus in the anterior mediastinum with activity equal to mediastinal activity ( Deauville 2 ). Favored differential of normal thymic tissue versus mild thymic rebound. Residual lymphoma is not favored but recommend attention on follow-up.   10/10/2016 PET scan   1. No evidence for hypermetabolic lymphadenopathy in the neck or chest on today's study (Deauville 1). 2. No change in the minimal FDG uptake identified in the thymic remnant on today's study (Deauville 2). This is probably physiologic and most likely unrelated to lymphoma   04/18/2017 Imaging   1. No evidence of active lymphoma or acute process in the chest, abdomen, or pelvis. 2. Similar residual thymic tissue in the anterior mediastinum, within normal variation in this age group. 3. Aortic Atherosclerosis (ICD10-I70.0). This is markedly age advanced.     REVIEW OF SYSTEMS:   Constitutional: Denies fevers, chills or abnormal weight loss Eyes: Denies blurriness of vision Ears, nose, mouth, throat, and face: Denies mucositis or sore throat Gastrointestinal:  Denies nausea, heartburn or change in bowel habits Lymphatics: Denies new lymphadenopathy or easy bruising Neurological:Denies numbness, tingling or new weaknesses Behavioral/Psych: Mood is stable, no new changes  All other systems were reviewed with the patient and are negative.  I have reviewed the past medical history, past surgical history, social history and family history with the patient and they are unchanged from previous note.  ALLERGIES:  has No Known Allergies.  MEDICATIONS:  Current Outpatient Medications  Medication Sig Dispense Refill  . aspirin 325 MG tablet  Take 325 mg by mouth daily.    Marland Kitchen ibuprofen (ADVIL,MOTRIN) 200 MG tablet Take 400-600 mg by mouth every 6 (six) hours as needed for headache.    . Multiple Vitamin (MULTIVITAMIN) tablet Take 1 tablet by mouth daily.     No current facility-administered medications for this visit.     PHYSICAL EXAMINATION: ECOG PERFORMANCE STATUS: 1 - Symptomatic but completely ambulatory  Vitals:   01/29/19 1210  BP: 115/81  Pulse: 80  Resp: 18  Temp: 97.8 F (36.6 C)  SpO2: 98%   Filed Weights   01/29/19 1210  Weight: 143 lb 6.4 oz (65 kg)    GENERAL:alert, no distress and comfortable SKIN: He has erythematous skin rash at the back of his neck  EYES: normal, Conjunctiva are pink and non-injected, sclera clear OROPHARYNX:no exudate, no erythema and lips, buccal mucosa, and tongue normal  NECK: supple, thyroid normal size, non-tender, without nodularity LYMPH: He has palpable lymphadenopathy on both sides of his neck LUNGS: clear to auscultation and percussion with normal breathing effort HEART: regular rate & rhythm and no murmurs and no lower extremity edema ABDOMEN:abdomen soft,  non-tender and normal bowel sounds Musculoskeletal:no cyanosis of digits and no clubbing  NEURO: alert & oriented x 3 with fluent speech, no focal motor/sensory deficits  LABORATORY DATA:  I have reviewed the data as listed    Component Value Date/Time   NA 141 01/29/2019 1227   NA 140 05/16/2017 1346   NA 139 01/10/2017 0908   K 4.4 01/29/2019 1227   K 3.8 01/10/2017 0908   CL 104 01/29/2019 1227   CO2 28 01/29/2019 1227   CO2 27 01/10/2017 0908   GLUCOSE 92 01/29/2019 1227   GLUCOSE 71 01/10/2017 0908   BUN 8 01/29/2019 1227   BUN 7 05/16/2017 1346   BUN 7.3 01/10/2017 0908   CREATININE 0.90 01/29/2019 1227   CREATININE 0.9 01/10/2017 0908   CALCIUM 9.1 01/29/2019 1227   CALCIUM 9.4 01/10/2017 0908   PROT 7.3 01/29/2019 1227   PROT 6.8 05/16/2017 1346   PROT 7.3 01/10/2017 0908   ALBUMIN 4.7  01/29/2019 1227   ALBUMIN 4.6 05/16/2017 1346   ALBUMIN 4.2 01/10/2017 0908   AST 19 01/29/2019 1227   AST 19 01/10/2017 0908   ALT 14 01/29/2019 1227   ALT 14 01/10/2017 0908   ALKPHOS 76 01/29/2019 1227   ALKPHOS 74 01/10/2017 0908   BILITOT 0.4 01/29/2019 1227   BILITOT 0.3 05/16/2017 1346   BILITOT 0.65 01/10/2017 0908   GFRNONAA >60 01/29/2019 1227   GFRAA >60 01/29/2019 1227    No results found for: SPEP, UPEP  Lab Results  Component Value Date   WBC 5.7 01/29/2019   NEUTROABS 3.3 01/29/2019   HGB 15.0 01/29/2019   HCT 43.6 01/29/2019   MCV 91.8 01/29/2019   PLT 215 01/29/2019      Chemistry      Component Value Date/Time   NA 141 01/29/2019 1227   NA 140 05/16/2017 1346   NA 139 01/10/2017 0908   K 4.4 01/29/2019 1227   K 3.8 01/10/2017 0908   CL 104 01/29/2019 1227   CO2 28 01/29/2019 1227   CO2 27 01/10/2017 0908   BUN 8 01/29/2019 1227   BUN 7 05/16/2017 1346   BUN 7.3 01/10/2017 0908   CREATININE 0.90 01/29/2019 1227   CREATININE 0.9 01/10/2017 0908      Component Value Date/Time   CALCIUM 9.1 01/29/2019 1227   CALCIUM 9.4 01/10/2017 0908   ALKPHOS 76 01/29/2019 1227   ALKPHOS 74 01/10/2017 0908   AST 19 01/29/2019 1227   AST 19 01/10/2017 0908   ALT 14 01/29/2019 1227   ALT 14 01/10/2017 0908   BILITOT 0.4 01/29/2019 1227   BILITOT 0.3 05/16/2017 1346   BILITOT 0.65 01/10/2017 0908       All questions were answered. The patient knows to call the clinic with any problems, questions or concerns. No barriers to learning was detected.  I spent 15 minutes counseling the patient face to face. The total time spent in the appointment was 20 minutes and more than 50% was on counseling and review of test results  Heath Lark, MD 01/29/2019 1:13 PM

## 2019-01-29 NOTE — Assessment & Plan Note (Signed)
We discussed the importance of tobacco cessation 

## 2019-01-30 ENCOUNTER — Telehealth: Payer: Self-pay | Admitting: *Deleted

## 2019-01-30 ENCOUNTER — Telehealth: Payer: Self-pay | Admitting: Hematology and Oncology

## 2019-01-30 ENCOUNTER — Other Ambulatory Visit: Payer: Self-pay

## 2019-01-30 ENCOUNTER — Other Ambulatory Visit: Payer: Self-pay | Admitting: Hematology and Oncology

## 2019-01-30 DIAGNOSIS — Z20822 Contact with and (suspected) exposure to covid-19: Secondary | ICD-10-CM

## 2019-01-30 DIAGNOSIS — C8118 Nodular sclerosis classical Hodgkin lymphoma, lymph nodes of multiple sites: Secondary | ICD-10-CM

## 2019-01-30 NOTE — Telephone Encounter (Signed)
-----   Message from Heath Lark, MD sent at 01/30/2019  8:16 AM EST ----- Regarding: PET scan next week Can you help me get that scheduled and authorized?

## 2019-01-30 NOTE — Telephone Encounter (Signed)
I reviewed the imaging results and blood work with the patient The test results came back unremarkable but that does not explain how he feels He is at high risk of cancer recurrence I recommend we proceed with PET CT scan and he agreed

## 2019-01-30 NOTE — Telephone Encounter (Signed)
Patient is scheduled for 12/21. He reports his wife received positive COVID results this morning. She has been asymptomatic. He will be going to get tested today.

## 2019-02-01 LAB — NOVEL CORONAVIRUS, NAA: SARS-CoV-2, NAA: NOT DETECTED

## 2019-02-02 NOTE — Telephone Encounter (Signed)
Called back and given below message. He would like to leave appt as scheduled. Since his wife is sick, he is watching the kids now. He is okay with seeing Dr. Alvy Bimler the week of 12/28.

## 2019-02-02 NOTE — Telephone Encounter (Signed)
He tested negative Can the PET scan be done sooner? I will not be able to review results with him until the following week

## 2019-02-02 NOTE — Telephone Encounter (Signed)
Called and left a message asking him to call the office. 

## 2019-02-03 NOTE — Telephone Encounter (Signed)
I sent scheduling msg 

## 2019-02-04 ENCOUNTER — Telehealth: Payer: Self-pay | Admitting: Hematology and Oncology

## 2019-02-04 NOTE — Telephone Encounter (Signed)
Scheduled appt per 12/8 sch message - pt aware of appt date and time   

## 2019-02-05 ENCOUNTER — Other Ambulatory Visit: Payer: Self-pay

## 2019-02-05 DIAGNOSIS — Z20822 Contact with and (suspected) exposure to covid-19: Secondary | ICD-10-CM

## 2019-02-06 LAB — NOVEL CORONAVIRUS, NAA: SARS-CoV-2, NAA: NOT DETECTED

## 2019-02-16 ENCOUNTER — Other Ambulatory Visit: Payer: Self-pay

## 2019-02-16 ENCOUNTER — Ambulatory Visit (HOSPITAL_COMMUNITY)
Admission: RE | Admit: 2019-02-16 | Discharge: 2019-02-16 | Disposition: A | Discharge status: Home / Self Care | Payer: No Typology Code available for payment source | Source: Ambulatory Visit | Attending: Hematology and Oncology | Admitting: Hematology and Oncology

## 2019-02-16 DIAGNOSIS — C8118 Nodular sclerosis classical Hodgkin lymphoma, lymph nodes of multiple sites: Secondary | ICD-10-CM | POA: Diagnosis not present

## 2019-02-16 LAB — GLUCOSE, CAPILLARY: Glucose-Capillary: 90 mg/dL (ref 70–99)

## 2019-02-16 MED ORDER — FLUDEOXYGLUCOSE F - 18 (FDG) INJECTION
7.4800 | Freq: Once | INTRAVENOUS | Status: AC | PRN
  Administered 2019-02-16: 12:00:00 7.48 via INTRAVENOUS

## 2019-02-25 ENCOUNTER — Inpatient Hospital Stay (HOSPITAL_BASED_OUTPATIENT_CLINIC_OR_DEPARTMENT_OTHER): Payer: No Typology Code available for payment source | Admitting: Hematology and Oncology

## 2019-02-25 ENCOUNTER — Other Ambulatory Visit: Payer: Self-pay

## 2019-02-25 DIAGNOSIS — C8118 Nodular sclerosis classical Hodgkin lymphoma, lymph nodes of multiple sites: Secondary | ICD-10-CM

## 2019-02-25 DIAGNOSIS — Z720 Tobacco use: Secondary | ICD-10-CM | POA: Diagnosis not present

## 2019-02-26 ENCOUNTER — Encounter: Payer: Self-pay | Admitting: Hematology and Oncology

## 2019-02-26 NOTE — Progress Notes (Signed)
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 NM PET Image Restag (PS) Skull Base To Thigh    Standing Status:   Future    Standing Expiration Date:   02/26/2020    Order Specific Question:   If indicated for the ordered procedure, I authorize the administration of a radiopharmaceutical per Radiology protocol    Answer:   Yes    Order Specific Question:   Preferred imaging location?    Answer:   [facility]    Order Specific Question:   Radiology Contrast Protocol - do NOT remove file path    Answer:   [email or web address]    INTERVAL HISTORY: Please see below for problem oriented charting. 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   07/28/2015 Pathology Results   Accession: [id number] core needle biopsy of the right supraclavicular region came back positive for classical Hodgkin lymphoma   08/22/2015 Bone Marrow Biopsy   Accession: [id number] Bone marrow is negative for involvement   08/22/2015 Procedure   He has port placement and bone marrow biopsy   08/23/2015 Imaging   ECHO showed normal EF   08/24/2015 PET scan   Bilateral hypermetabolic supraclavicular lymphadenopathy and mediastinal adenopathy consistent with known Hodgkin's lymphoma. 2. No disease is identified below the diaphragms.   08/25/2015 -  Chemotherapy   He is started on ABVD   09/19/2015 Imaging   Superficial thrombosis is noted in the left greater saphenous vein, from distal thigh extending up to groin. Non-occlusive DVT is extending into the left common femoral vein, at the   saphenofemoral junction.   09/22/2015 Adverse Reaction   Due to cough, Bleomycin is placed on hold   10/14/2015 PET scan   Marked reduction in size of the the thoracic and supraclavicular adenopathy, with interval resolution of the prior hypermetabolic activity indicating response to effective therapy. No significant residual hypermetabolic adenopathy.   03/28/2016 Procedure   Successful removal of implanted Port-A-Cath.   05/18/2016 PET scan   No residual hypermetabolic lymph nodes in the neck or  mediastinum ( Deauville 1). Small amount of residual thymus in the anterior mediastinum with activity equal to mediastinal activity ( Deauville 2 ). Favored differential of normal thymic tissue versus mild thymic rebound. Residual lymphoma is not favored but  recommend attention on follow-up.   10/10/2016 PET scan   1. No evidence for hypermetabolic lymphadenopathy in the neck or chest on today's study (Deauville 1). 2. No change in the minimal FDG uptake identified in the thymic remnant on today's study (Deauville 2). This is probably physiologic and most likely unrelated to lymphoma   04/18/2017 Imaging   1. No evidence of active lymphoma or acute process in the chest, abdomen, or pelvis. 2. Similar residual thymic tissue in the anterior mediastinum, within normal variation in this age group. 3. Aortic Atherosclerosis (ICD10-I70.0). This is markedly age advanced.   02/16/2019 PET scan   Small left axillary and left level II cervical nodes with mild hypermetabolism, as above. Deauville category 3.   Small left inguinal nodes, without appreciable hypermetabolism, favored to be reactive.   Spleen is normal in size.     REVIEW OF SYSTEMS:   Constitutional: Denies fevers, chills or abnormal weight loss Eyes: Denies blurriness of vision Ears, nose, mouth, throat, and face: Denies mucositis or sore throat Respiratory: Denies cough, dyspnea or wheezes Cardiovascular: Denies palpitation, chest discomfort or lower extremity swelling Gastrointestinal:  Denies nausea, heartburn or change in bowel habits Skin: Denies abnormal skin rashes Neurological:Denies numbness, tingling or new weaknesses Behavioral/Psych: Mood is stable, no new changes  All other systems were reviewed with the patient and are negative.  I have reviewed the past medical history, past surgical history, social history and family history with the patient and they are unchanged from previous note.  ALLERGIES:  has No Known Allergies.  MEDICATIONS:  Current Outpatient Medications  Medication Sig Dispense Refill  . aspirin 325 MG tablet Take 325 mg by mouth daily.    Marland Kitchen ibuprofen (ADVIL,MOTRIN) 200 MG tablet Take 400-600 mg by mouth every 6 (six) hours as needed for headache.     . Multiple Vitamin (MULTIVITAMIN) tablet Take 1 tablet by mouth daily.     No current facility-administered medications for this visit.    PHYSICAL EXAMINATION: ECOG PERFORMANCE STATUS: 1 - Symptomatic but completely ambulatory  Vitals:   02/25/19 1150  BP: 131/74  Pulse: 66  Resp: 20  Temp: 98.7 F (37.1 C)  SpO2: 100%   Filed Weights   02/25/19 1150  Weight: 140 lb (63.5 kg)    GENERAL:alert, no distress and comfortable Musculoskeletal:no cyanosis of digits and no clubbing  NEURO: alert & oriented x 3 with fluent speech, no focal motor/sensory deficits  LABORATORY DATA:  I have reviewed the data as listed    Component Value Date/Time   NA 141 01/29/2019 1227   NA 140 05/16/2017 1346   NA 139 01/10/2017 0908   K 4.4 01/29/2019 1227   K 3.8 01/10/2017 0908   CL 104 01/29/2019 1227   CO2 28 01/29/2019 1227   CO2 27 01/10/2017 0908   GLUCOSE 92 01/29/2019 1227   GLUCOSE 71 01/10/2017 0908   BUN 8 01/29/2019 1227   BUN 7 05/16/2017 1346   BUN 7.3 01/10/2017 0908   CREATININE 0.90 01/29/2019 1227   CREATININE 0.9 01/10/2017 0908   CALCIUM 9.1 01/29/2019 1227   CALCIUM 9.4 01/10/2017 0908   PROT 7.3 01/29/2019 1227   PROT 6.8 05/16/2017 1346   PROT 7.3 01/10/2017 0908   ALBUMIN 4.7 01/29/2019  1227   ALBUMIN 4.6 05/16/2017 1346   ALBUMIN 4.2 01/10/2017 0908   AST 19 01/29/2019 1227   AST 19 01/10/2017 0908   ALT 14 01/29/2019 1227   ALT 14 01/10/2017 0908   ALKPHOS 76 01/29/2019 1227   ALKPHOS 74 01/10/2017 0908   BILITOT 0.4 01/29/2019 1227   BILITOT 0.3 05/16/2017 1346   BILITOT 0.65 01/10/2017 0908   GFRNONAA >60 01/29/2019 1227   GFRAA >60 01/29/2019 1227    No results found for: SPEP, UPEP  Lab Results  Component Value Date   WBC 5.7 01/29/2019   NEUTROABS 3.3 01/29/2019   HGB 15.0 01/29/2019   HCT 43.6 01/29/2019   MCV 91.8 01/29/2019   PLT 215 01/29/2019      Chemistry      Component Value Date/Time   NA 141 01/29/2019 1227   NA 140  05/16/2017 1346   NA 139 01/10/2017 0908   K 4.4 01/29/2019 1227   K 3.8 01/10/2017 0908   CL 104 01/29/2019 1227   CO2 28 01/29/2019 1227   CO2 27 01/10/2017 0908   BUN 8 01/29/2019 1227   BUN 7 05/16/2017 1346   BUN 7.3 01/10/2017 0908   CREATININE 0.90 01/29/2019 1227   CREATININE 0.9 01/10/2017 0908      Component Value Date/Time   CALCIUM 9.1 01/29/2019 1227   CALCIUM 9.4 01/10/2017 0908   ALKPHOS 76 01/29/2019 1227   ALKPHOS 74 01/10/2017 0908   AST 19 01/29/2019 1227   AST 19 01/10/2017 0908   ALT 14 01/29/2019 1227   ALT 14 01/10/2017 0908   BILITOT 0.4 01/29/2019 1227   BILITOT 0.3 05/16/2017 1346   BILITOT 0.65 01/10/2017 0908       RADIOGRAPHIC STUDIES: I have reviewed multiple imaging studies with the patient I have personally reviewed the radiological images as listed and agreed with the findings in the report. DG Chest 2 View  Result Date: 01/29/2019 CLINICAL DATA:  Chest pain.  History of Hodgkin's lymphoma EXAM: CHEST - 2 VIEW COMPARISON:  September 22, 2015 FINDINGS: The lungs are clear. Heart size and pulmonary vascularity are normal. No adenopathy. No pneumothorax. No bone lesions. IMPRESSION: Lungs clear.  No adenopathy. Electronically Signed   By: Lowella Grip III M.D.   On: 01/29/2019 16:43   NM PET Image Restag (PS) Skull Base To Thigh  Result Date: 02/16/2019 CLINICAL DATA:  Subsequent treatment strategy for Hodgkin's lymphoma. EXAM: NUCLEAR MEDICINE PET SKULL BASE TO THIGH TECHNIQUE: 7.5 mCi F-18 FDG was injected intravenously. Full-ring PET imaging was performed from the skull base to thigh after the radiotracer. CT data was obtained and used for attenuation correction and anatomic localization. Fasting blood glucose: 90 mg/dl COMPARISON:  CT chest abdomen pelvis dated 04/18/2017. PET-CT dated 10/10/2016 FINDINGS: Mediastinal blood pool activity: SUV max 2.1 Liver activity: SUV max 2.9 NECK: 7 mm short axis left level II node (series 4/image 19), max  SUV 2.9. Incidental CT findings: none CHEST: Small left axillary nodes measuring up to 7 mm short axis (series 4/image 53), max SUV 2.9. No hypermetabolic mediastinal lymphadenopathy. Stable triangular anterior mediastinal soft tissue, favoring a thymic remnant, max SUV 2.3. No suspicious pulmonary nodules. Incidental CT findings: none ABDOMEN/PELVIS: Small bilateral inguinal nodes, measuring up to 9 mm short axis on the left, max SUV 1.9. Otherwise, no suspicious abdominopelvic lymphadenopathy. Spleen is normal in size. No abnormal hypermetabolism in the liver, spleen, pancreas, or adrenal glands. Incidental CT findings: none SKELETON: No focal hypermetabolic activity  to suggest skeletal metastasis. Incidental CT findings: none IMPRESSION: Small left axillary and left level II cervical nodes with mild hypermetabolism, as above. Deauville category 3. Small left inguinal nodes, without appreciable hypermetabolism, favored to be reactive. Spleen is normal in size. Electronically Signed   By: Julian Hy M.D.   On: 02/16/2019 15:15    All questions were answered. The patient knows to call the clinic with any problems, questions or concerns. No barriers to learning was detected.  I spent 15 minutes counseling the patient face to face. The total time spent in the appointment was 20 minutes and more than 50% was on counseling and review of test results  Heath Lark, MD 02/26/2019 8:00 AM

## 2019-02-26 NOTE — Assessment & Plan Note (Signed)
We discussed the importance of tobacco cessation He is attempting to quit on his own

## 2019-02-26 NOTE — Assessment & Plan Note (Addendum)
I have reviewed multiple imaging studies with the patient Even though recent PET CT scan showed multiple lymphadenopathy with Deauville 3 criteria, the size of those lymph nodes were very small He had recent upper respiratory tract illness which could have caused reactive lymphadenopathy He is not symptomatic I favor observation with repeat imaging study again in 3 months I will get his case presented at the next hematology tumor board

## 2019-03-02 ENCOUNTER — Telehealth: Payer: Self-pay | Admitting: Hematology and Oncology

## 2019-03-02 NOTE — Telephone Encounter (Signed)
Scheduled appt per 12/30 sch message - unable to reach pt . Left message with appt date and time

## 2019-05-18 ENCOUNTER — Telehealth: Payer: Self-pay

## 2019-05-18 NOTE — Telephone Encounter (Signed)
Called and scheduled PET for 3/30 at 1 pm, arrive at 1230 , nothing to eat or drink for 6 hours prior to PET except water. Lab appt for 3/30 at 1130 at [facility]. Called and given appt dates and times to [facility]. He verbalized understanding. Told him a scheduler will call with appt for Dr. Alvy Bimler.

## 2019-05-18 NOTE — Telephone Encounter (Signed)
-----   Message from Heath Lark, MD sent at 05/18/2019  8:32 AM EDT ----- Regarding: can you help schedule PET scan next week on 3/29 and alert authorization team?

## 2019-05-22 ENCOUNTER — Telehealth: Payer: Self-pay

## 2019-05-22 NOTE — Telephone Encounter (Signed)
Called and left a message asking him to call the office regarding PET and appts at office. Appointments need to be moved.

## 2019-05-25 ENCOUNTER — Other Ambulatory Visit: Payer: No Typology Code available for payment source

## 2019-05-25 NOTE — Telephone Encounter (Signed)
Called and scheduled lab appt at 0930 on 4/8 and appt with Dr. Alvy Bimler at [facility] on 4/9. He verbalized understating.

## 2019-05-26 ENCOUNTER — Inpatient Hospital Stay: Payer: No Typology Code available for payment source

## 2019-05-26 ENCOUNTER — Encounter (HOSPITAL_COMMUNITY): Payer: No Typology Code available for payment source

## 2019-05-26 ENCOUNTER — Ambulatory Visit: Payer: No Typology Code available for payment source | Admitting: Hematology and Oncology

## 2019-05-28 ENCOUNTER — Inpatient Hospital Stay: Payer: No Typology Code available for payment source | Admitting: Hematology and Oncology

## 2019-06-04 ENCOUNTER — Inpatient Hospital Stay: Payer: No Typology Code available for payment source | Attending: Hematology and Oncology

## 2019-06-04 ENCOUNTER — Other Ambulatory Visit: Payer: Self-pay

## 2019-06-04 ENCOUNTER — Encounter (HOSPITAL_COMMUNITY)
Admission: RE | Admit: 2019-06-04 | Discharge: 2019-06-04 | Disposition: A | Discharge status: Home / Self Care | Payer: No Typology Code available for payment source | Source: Ambulatory Visit | Attending: Hematology and Oncology | Admitting: Hematology and Oncology

## 2019-06-04 DIAGNOSIS — C8118 Nodular sclerosis classical Hodgkin lymphoma, lymph nodes of multiple sites: Secondary | ICD-10-CM | POA: Insufficient documentation

## 2019-06-04 DIAGNOSIS — Z9221 Personal history of antineoplastic chemotherapy: Secondary | ICD-10-CM | POA: Insufficient documentation

## 2019-06-04 DIAGNOSIS — Z791 Long term (current) use of non-steroidal anti-inflammatories (NSAID): Secondary | ICD-10-CM | POA: Insufficient documentation

## 2019-06-04 DIAGNOSIS — F17210 Nicotine dependence, cigarettes, uncomplicated: Secondary | ICD-10-CM | POA: Insufficient documentation

## 2019-06-04 DIAGNOSIS — Z7982 Long term (current) use of aspirin: Secondary | ICD-10-CM | POA: Insufficient documentation

## 2019-06-04 LAB — GLUCOSE, CAPILLARY: Glucose-Capillary: 90 mg/dL (ref 70–99)

## 2019-06-04 MED ORDER — FLUDEOXYGLUCOSE F - 18 (FDG) INJECTION
6.9500 | Freq: Once | INTRAVENOUS | Status: AC | PRN
  Administered 2019-06-04: 08:00:00 6.95 via INTRAVENOUS

## 2019-06-05 ENCOUNTER — Inpatient Hospital Stay (HOSPITAL_BASED_OUTPATIENT_CLINIC_OR_DEPARTMENT_OTHER): Payer: No Typology Code available for payment source | Admitting: Hematology and Oncology

## 2019-06-05 ENCOUNTER — Other Ambulatory Visit: Payer: Self-pay

## 2019-06-05 ENCOUNTER — Encounter: Payer: Self-pay | Admitting: Hematology and Oncology

## 2019-06-05 DIAGNOSIS — C8118 Nodular sclerosis classical Hodgkin lymphoma, lymph nodes of multiple sites: Secondary | ICD-10-CM

## 2019-06-05 DIAGNOSIS — Z720 Tobacco use: Secondary | ICD-10-CM | POA: Diagnosis not present

## 2019-06-05 DIAGNOSIS — Z7982 Long term (current) use of aspirin: Secondary | ICD-10-CM | POA: Diagnosis not present

## 2019-06-05 DIAGNOSIS — Z9221 Personal history of antineoplastic chemotherapy: Secondary | ICD-10-CM | POA: Diagnosis not present

## 2019-06-05 DIAGNOSIS — Z791 Long term (current) use of non-steroidal anti-inflammatories (NSAID): Secondary | ICD-10-CM | POA: Diagnosis not present

## 2019-06-05 DIAGNOSIS — F17210 Nicotine dependence, cigarettes, uncomplicated: Secondary | ICD-10-CM | POA: Diagnosis not present

## 2019-06-05 NOTE — Assessment & Plan Note (Signed)
I have reviewed PET CT scan with the patient extensively He has a negative PET scan Based on current guidelines, there is no role for future surveillance imaging I will see him in a year for further follow-up with history, physical examination and blood work only

## 2019-06-05 NOTE — Progress Notes (Signed)
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   All questions were answered. The patient knows to call the clinic with any problems, questions or concerns. The total time spent in the appointment was 15 minutes encounter with patients including review of chart and various tests results, discussions about plan of care and coordination of care plan   Heath Lark, MD 06/05/2019 8:24 AM  INTERVAL HISTORY: Please see below for problem oriented charting. 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   07/28/2015 Pathology Results   Accession:  [id number] core needle biopsy of the right supraclavicular region came back positive for classical Hodgkin lymphoma   08/22/2015 Bone Marrow Biopsy   Accession: [id number] Bone marrow is negative for involvement   08/22/2015 Procedure   He has port placement and bone marrow biopsy   08/23/2015 Imaging   ECHO showed normal EF   08/24/2015 PET scan   Bilateral hypermetabolic supraclavicular lymphadenopathy and mediastinal adenopathy consistent with known Hodgkin's lymphoma. 2. No disease is identified below the diaphragms.   08/25/2015 -  Chemotherapy   He is started on ABVD   09/19/2015 Imaging   Superficial thrombosis is noted in the left greater saphenous vein, from distal thigh extending up to groin. Non-occlusive DVT is extending into the left common femoral vein, at the   saphenofemoral junction.   09/22/2015 Adverse Reaction   Due to cough, Bleomycin is placed on hold   10/14/2015 PET scan   Marked reduction in size of the the thoracic and supraclavicular adenopathy, with interval resolution of the prior hypermetabolic activity indicating response to effective therapy. No significant residual hypermetabolic adenopathy.   03/28/2016 Procedure   Successful removal of implanted Port-A-Cath.   05/18/2016 PET scan   No residual hypermetabolic lymph nodes in the neck or mediastinum ( Deauville 1). Small amount of residual thymus in the anterior mediastinum with activity equal to mediastinal activity ( Deauville 2 ). Favored differential of normal thymic tissue versus mild thymic rebound. Residual lymphoma is not favored but recommend attention on follow-up.   10/10/2016 PET scan   1. No evidence for hypermetabolic lymphadenopathy in the neck or chest on today's study (Deauville 1). 2. No  change in the minimal FDG uptake identified in the thymic remnant on today's study (Deauville 2). This is probably physiologic and most likely unrelated to lymphoma   04/18/2017 Imaging   1. No evidence of  active lymphoma or acute process in the chest, abdomen, or pelvis. 2. Similar residual thymic tissue in the anterior mediastinum, within normal variation in this age group. 3. Aortic Atherosclerosis (ICD10-I70.0). This is markedly age advanced.   02/16/2019 PET scan   Small left axillary and left level II cervical nodes with mild hypermetabolism, as above. Deauville category 3.   Small left inguinal nodes, without appreciable hypermetabolism, favored to be reactive.   Spleen is normal in size.   06/04/2019 PET scan   Decreased metabolic activity, less than blood pool within lymph nodes in the neck and chest of small lymph nodes in the left neck and left axilla as described. Deauville category 2.   Normal spleen.       REVIEW OF SYSTEMS:   Constitutional: Denies fevers, chills or abnormal weight loss Eyes: Denies blurriness of vision Ears, nose, mouth, throat, and face: Denies mucositis or sore throat Respiratory: Denies cough, dyspnea or wheezes Cardiovascular: Denies palpitation, chest discomfort or lower extremity swelling Gastrointestinal:  Denies nausea, heartburn or change in bowel habits Skin: Denies abnormal skin rashes Lymphatics: Denies new lymphadenopathy or easy bruising Neurological:Denies numbness, tingling or new weaknesses Behavioral/Psych: Mood is stable, no new changes  All other systems were reviewed with the patient and are negative.  I have reviewed the past medical history, past surgical history, social history and family history with the patient and they are unchanged from previous note.  ALLERGIES:  has No Known Allergies.  MEDICATIONS:  Current Outpatient Medications  Medication Sig Dispense Refill  . aspirin 325 MG tablet Take 325 mg by mouth daily.    Marland Kitchen ibuprofen (ADVIL,MOTRIN) 200 MG tablet Take 400-600 mg by mouth every 6 (six) hours as needed for headache.    . Multiple Vitamin (MULTIVITAMIN) tablet Take 1 tablet by mouth daily.     No current  facility-administered medications for this visit.    PHYSICAL EXAMINATION: ECOG PERFORMANCE STATUS: 0 - Asymptomatic  Vitals:   06/05/19 0814  BP: 106/70  Pulse: (!) 58  Resp: 18  Temp: 98.5 F (36.9 C)  SpO2: 100%   Filed Weights   06/05/19 0814  Weight: 145 lb 3.2 oz (65.9 kg)    GENERAL:alert, no distress and comfortable  LABORATORY DATA:  I have reviewed the data as listed    Component Value Date/Time   NA 141 01/29/2019 1227   NA 140 05/16/2017 1346   NA 139 01/10/2017 0908   K 4.4 01/29/2019 1227   K 3.8 01/10/2017 0908   CL 104 01/29/2019 1227   CO2 28 01/29/2019 1227   CO2 27 01/10/2017 0908   GLUCOSE 92 01/29/2019 1227   GLUCOSE 71 01/10/2017 0908   BUN 8 01/29/2019 1227   BUN 7 05/16/2017 1346   BUN 7.3 01/10/2017 0908   CREATININE 0.90 01/29/2019 1227   CREATININE 0.9 01/10/2017 0908   CALCIUM 9.1 01/29/2019 1227   CALCIUM 9.4 01/10/2017 0908   PROT 7.3 01/29/2019 1227   PROT 6.8 05/16/2017 1346   PROT 7.3 01/10/2017 0908   ALBUMIN 4.7 01/29/2019 1227   ALBUMIN 4.6 05/16/2017 1346   ALBUMIN 4.2 01/10/2017 0908   AST 19 01/29/2019 1227   AST 19 01/10/2017 0908   ALT 14 01/29/2019 1227   ALT 14 01/10/2017 0908  ALKPHOS 76 01/29/2019 1227   ALKPHOS 74 01/10/2017 0908   BILITOT 0.4 01/29/2019 1227   BILITOT 0.3 05/16/2017 1346   BILITOT 0.65 01/10/2017 0908   GFRNONAA >60 01/29/2019 1227   GFRAA >60 01/29/2019 1227    No results found for: SPEP, UPEP  Lab Results  Component Value Date   WBC 5.7 01/29/2019   NEUTROABS 3.3 01/29/2019   HGB 15.0 01/29/2019   HCT 43.6 01/29/2019   MCV 91.8 01/29/2019   PLT 215 01/29/2019      Chemistry      Component Value Date/Time   NA 141 01/29/2019 1227   NA 140 05/16/2017 1346   NA 139 01/10/2017 0908   K 4.4 01/29/2019 1227   K 3.8 01/10/2017 0908   CL 104 01/29/2019 1227   CO2 28 01/29/2019 1227   CO2 27 01/10/2017 0908   BUN 8 01/29/2019 1227   BUN 7 05/16/2017 1346   BUN 7.3  01/10/2017 0908   CREATININE 0.90 01/29/2019 1227   CREATININE 0.9 01/10/2017 0908      Component Value Date/Time   CALCIUM 9.1 01/29/2019 1227   CALCIUM 9.4 01/10/2017 0908   ALKPHOS 76 01/29/2019 1227   ALKPHOS 74 01/10/2017 0908   AST 19 01/29/2019 1227   AST 19 01/10/2017 0908   ALT 14 01/29/2019 1227   ALT 14 01/10/2017 0908   BILITOT 0.4 01/29/2019 1227   BILITOT 0.3 05/16/2017 1346   BILITOT 0.65 01/10/2017 0908       RADIOGRAPHIC STUDIES: I have reviewed PET CT scan with the patient I have personally reviewed the radiological images as listed and agreed with the findings in the report. NM PET Image Restag (PS) Skull Base To Thigh  Result Date: 06/04/2019 CLINICAL DATA:  Subsequent treatment strategy for lymphoma. EXAM: NUCLEAR MEDICINE PET SKULL BASE TO THIGH TECHNIQUE: 6.95 mCi F-18 FDG was injected intravenously. Full-ring PET imaging was performed from the skull base to thigh after the radiotracer. CT data was obtained and used for attenuation correction and anatomic localization. Fasting blood glucose: 90 mg/dl COMPARISON:  02/16/2019 FINDINGS: Mediastinal blood pool activity: SUV max 2.36 Liver activity: SUV max 2.85 NECK: Left II lymph node (image 26, series 4) (SUVmax = 2.0) previously 2.9 Incidental CT findings: none CHEST: Left axillary lymph nodes. Largest 8 mm previously indexed. (SUVmax = 1.71) previously 2.9 no hypermetabolic mediastinal or hilar lymphadenopathy Incidental CT findings: Triangular marbled appearing soft tissue and fat in the anterior mediastinum favoring residual thymic tissue as before. Apical scarring similar to the prior study. ABDOMEN/PELVIS: No abnormal hypermetabolic activity within the liver, pancreas, adrenal glands, or spleen. No hypermetabolic lymph nodes in the abdomen or pelvis. Bilateral small inguinal lymph nodes largest on the left measuring approximately 1 cm with similar appearance to the prior study in terms of size (SUVmax = 2.0)  previously 1.9 Incidental CT findings: Spleen is normal size. SKELETON: No focal hypermetabolic activity to suggest skeletal metastasis. Incidental CT findings: none IMPRESSION: Decreased metabolic activity, less than blood pool within lymph nodes in the neck and chest of small lymph nodes in the left neck and left axilla as described. Deauville category 2. Normal spleen. Electronically Signed   By: Zetta Bills M.D.   On: 06/04/2019 10:43

## 2019-06-05 NOTE — Assessment & Plan Note (Signed)
He is working hard to quit smoking We discussed the importance of nicotine cessation He will try his best to quit before I see him back in a year

## 2019-06-08 ENCOUNTER — Telehealth: Payer: Self-pay | Admitting: Hematology and Oncology

## 2019-06-08 NOTE — Telephone Encounter (Signed)
Scheduled appts per 4/9 sch msg. Pt confirmed appt date and time.

## 2019-09-28 ENCOUNTER — Inpatient Hospital Stay: Payer: No Typology Code available for payment source

## 2019-09-28 ENCOUNTER — Inpatient Hospital Stay: Payer: No Typology Code available for payment source | Admitting: Hematology and Oncology

## 2019-09-28 ENCOUNTER — Telehealth: Payer: Self-pay | Admitting: Hematology and Oncology

## 2019-09-28 NOTE — Telephone Encounter (Signed)
R/s appt per 8/2 sch msg - unable to reach pt .left message with appt date and time

## 2019-10-14 ENCOUNTER — Inpatient Hospital Stay: Payer: No Typology Code available for payment source | Admitting: Hematology and Oncology

## 2019-10-14 ENCOUNTER — Encounter: Payer: Self-pay | Admitting: Hematology and Oncology

## 2019-10-14 ENCOUNTER — Inpatient Hospital Stay: Payer: No Typology Code available for payment source | Attending: Hematology and Oncology

## 2020-02-22 ENCOUNTER — Telehealth: Payer: Self-pay | Admitting: Hematology and Oncology

## 2020-02-22 NOTE — Telephone Encounter (Signed)
Rescheduled appointment per 12/27 schedule message. Patient is aware.

## 2020-03-10 ENCOUNTER — Telehealth: Payer: Self-pay

## 2020-03-10 NOTE — Telephone Encounter (Signed)
Attempted to call regarding appt scheduled on 1/17 to see if he wants to change virtual if no symptoms. Unable to leave a message voicemail full.

## 2020-03-11 ENCOUNTER — Telehealth: Payer: Self-pay | Admitting: Hematology and Oncology

## 2020-03-11 NOTE — Telephone Encounter (Signed)
Rescheduled 1/17 appts. Called pt no answer and I was unable to leave a msg. Mailing appt letter and calendar printout

## 2020-03-14 ENCOUNTER — Inpatient Hospital Stay: Payer: No Typology Code available for payment source | Admitting: Hematology and Oncology

## 2020-03-14 ENCOUNTER — Inpatient Hospital Stay: Payer: No Typology Code available for payment source

## 2020-03-30 ENCOUNTER — Encounter: Payer: Self-pay | Admitting: Hematology and Oncology

## 2020-03-30 ENCOUNTER — Inpatient Hospital Stay: Payer: No Typology Code available for payment source | Attending: Hematology and Oncology

## 2020-03-30 ENCOUNTER — Inpatient Hospital Stay (HOSPITAL_BASED_OUTPATIENT_CLINIC_OR_DEPARTMENT_OTHER): Payer: No Typology Code available for payment source | Admitting: Hematology and Oncology

## 2020-03-30 ENCOUNTER — Other Ambulatory Visit: Payer: Self-pay

## 2020-03-30 DIAGNOSIS — Z720 Tobacco use: Secondary | ICD-10-CM

## 2020-03-30 DIAGNOSIS — F419 Anxiety disorder, unspecified: Secondary | ICD-10-CM | POA: Diagnosis not present

## 2020-03-30 DIAGNOSIS — C8118 Nodular sclerosis classical Hodgkin lymphoma, lymph nodes of multiple sites: Secondary | ICD-10-CM

## 2020-03-30 DIAGNOSIS — Z86718 Personal history of other venous thrombosis and embolism: Secondary | ICD-10-CM

## 2020-03-30 DIAGNOSIS — Z8571 Personal history of Hodgkin lymphoma: Secondary | ICD-10-CM | POA: Diagnosis present

## 2020-03-30 DIAGNOSIS — F32A Depression, unspecified: Secondary | ICD-10-CM | POA: Diagnosis not present

## 2020-03-30 DIAGNOSIS — F411 Generalized anxiety disorder: Secondary | ICD-10-CM

## 2020-03-30 DIAGNOSIS — F17210 Nicotine dependence, cigarettes, uncomplicated: Secondary | ICD-10-CM | POA: Insufficient documentation

## 2020-03-30 DIAGNOSIS — Z7982 Long term (current) use of aspirin: Secondary | ICD-10-CM | POA: Insufficient documentation

## 2020-03-30 LAB — COMPREHENSIVE METABOLIC PANEL
ALT: 34 U/L (ref 0–44)
AST: 26 U/L (ref 15–41)
Albumin: 4.3 g/dL (ref 3.5–5.0)
Alkaline Phosphatase: 71 U/L (ref 38–126)
Anion gap: 5 (ref 5–15)
BUN: 11 mg/dL (ref 6–20)
CO2: 27 mmol/L (ref 22–32)
Calcium: 8.9 mg/dL (ref 8.9–10.3)
Chloride: 104 mmol/L (ref 98–111)
Creatinine, Ser: 0.98 mg/dL (ref 0.61–1.24)
GFR, Estimated: >60 mL/min (ref >60)
Glucose, Bld: 105 mg/dL — ABNORMAL HIGH (ref 70–99)
Potassium: 4.3 mmol/L (ref 3.5–5.1)
Sodium: 136 mmol/L (ref 135–145)
Total Bilirubin: 0.4 mg/dL (ref 0.3–1.2)
Total Protein: 6.9 g/dL (ref 6.5–8.1)

## 2020-03-30 LAB — CBC WITH DIFFERENTIAL/PLATELET
Abs Immature Granulocytes: 0.02 10*3/uL (ref 0.00–0.07)
Basophils Absolute: 0.1 10*3/uL (ref 0.0–0.1)
Basophils Relative: 1 %
Eosinophils Absolute: 0.5 10*3/uL (ref 0.0–0.5)
Eosinophils Relative: 7 %
HCT: 42.6 % (ref 39.0–52.0)
Hemoglobin: 14.9 g/dL (ref 13.0–17.0)
Immature Granulocytes: 0 %
Lymphocytes Relative: 25 %
Lymphs Abs: 1.7 10*3/uL (ref 0.7–4.0)
MCH: 32.1 pg (ref 26.0–34.0)
MCHC: 35 g/dL (ref 30.0–36.0)
MCV: 91.8 fL (ref 80.0–100.0)
Monocytes Absolute: 0.8 10*3/uL (ref 0.1–1.0)
Monocytes Relative: 12 %
Neutro Abs: 3.9 10*3/uL (ref 1.7–7.7)
Neutrophils Relative %: 55 %
Platelets: 211 10*3/uL (ref 150–400)
RBC: 4.64 MIL/uL (ref 4.22–5.81)
RDW: 12.9 % (ref 11.5–15.5)
WBC: 7 10*3/uL (ref 4.0–10.5)
nRBC: 0 % (ref 0.0–0.2)

## 2020-03-30 NOTE — Assessment & Plan Note (Signed)
He has a history of DVTs Unfortunately he continues to smoke He will continue aspirin therapy We discussed the importance of nicotine cessation

## 2020-03-30 NOTE — Assessment & Plan Note (Signed)
The patient have difficulties dealing with anxiety and depression He is recently separated from his wife He does not have a primary care doctor We discussed the risk and benefits of antidepressant or counseling with our social worker The patient would like to hold off for now

## 2020-03-30 NOTE — Assessment & Plan Note (Signed)
He is 4 years out from treatment He has no clinical signs or symptoms to suggest disease recurrence I will see him again in a year for further follow-up I recommend vaccination but the patient declined

## 2020-03-30 NOTE — Progress Notes (Signed)
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 [place]) He is 4 years out from treatment He has no clinical signs or symptoms to suggest disease recurrence I will see him again in a year for further follow-up I recommend vaccination but the patient declined  History of DVT (deep vein thrombosis) He has a history of DVTs Unfortunately he continues to smoke He will continue aspirin therapy We discussed the importance of nicotine cessation  Tobacco abuse He is working hard to quit smoking We discussed the importance of nicotine cessation He will try his best to quit before I see him back in a year  Anxiety states The patient have difficulties dealing with anxiety and depression He is recently separated from his wife He does not have a primary care doctor We discussed the risk and benefits of antidepressant or counseling with our social worker The patient would like to hold off for now   No orders of the defined types were placed in this encounter.   All questions were answered. The patient knows to call the clinic with any problems, questions or concerns. The total time spent in the appointment was 20 minutes encounter with patients including review of chart and various tests results, discussions about plan of care and coordination of care plan   Ross Lark, MD 03/30/2020 2:22 PM  INTERVAL HISTORY: Please see below for problem oriented charting. 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   07/28/2015 Pathology Results   Accession: [id number] core needle biopsy of the right supraclavicular region came back positive for classical Hodgkin lymphoma   08/22/2015 Bone Marrow Biopsy   Accession: [id number] Bone marrow is negative for involvement   08/22/2015 Procedure   He has port placement and bone marrow biopsy   08/23/2015 Imaging   ECHO showed normal EF   08/24/2015 PET scan   Bilateral hypermetabolic supraclavicular lymphadenopathy and mediastinal adenopathy consistent with known Hodgkin's lymphoma. 2. No disease is identified below the diaphragms.   08/25/2015 -  Chemotherapy   He is started on ABVD   09/19/2015 Imaging   Superficial thrombosis is noted in the left greater saphenous vein, from distal thigh extending up to groin. Non-occlusive DVT is extending into the left common femoral vein, at the   saphenofemoral junction.   09/22/2015 Adverse Reaction   Due to cough, Bleomycin is placed on hold   10/14/2015 PET scan   Marked reduction in size of the the thoracic and supraclavicular adenopathy, with interval resolution  of the prior hypermetabolic activity indicating response to effective therapy. No significant residual hypermetabolic adenopathy.   03/28/2016 Procedure   Successful removal of implanted Port-A-Cath.   05/18/2016 PET scan   No residual  hypermetabolic lymph nodes in the neck or mediastinum ( Deauville 1). Small amount of residual thymus in the anterior mediastinum with activity equal to mediastinal activity ( Deauville 2 ). Favored differential of normal thymic tissue versus mild thymic rebound. Residual lymphoma is not favored but recommend attention on follow-up.   10/10/2016 PET scan   1. No evidence for hypermetabolic lymphadenopathy in the neck or chest on today's study (Deauville 1). 2. No change in the minimal FDG uptake identified in the thymic remnant on today's study (Deauville 2). This is probably physiologic and most likely unrelated to lymphoma   04/18/2017 Imaging   1. No evidence of active lymphoma or acute process in the chest, abdomen, or pelvis. 2. Similar residual thymic tissue in the anterior mediastinum, within normal variation in this age group. 3. Aortic Atherosclerosis (ICD10-I70.0). This is markedly age advanced.   02/16/2019 PET scan   Small left axillary and left level II cervical nodes with mild hypermetabolism, as above. Deauville category 3.   Small left inguinal nodes, without appreciable hypermetabolism, favored to be reactive.   Spleen is normal in size.   06/04/2019 PET scan   Decreased metabolic activity, less than blood pool within lymph nodes in the neck and chest of small lymph nodes in the left neck and left axilla as described. Deauville category 2.   Normal spleen.       REVIEW OF SYSTEMS:   Constitutional: Denies fevers, chills or abnormal weight loss Eyes: Denies blurriness of vision Ears, nose, mouth, throat, and face: Denies mucositis or sore throat Respiratory: Denies cough, dyspnea or wheezes Cardiovascular: Denies palpitation, chest discomfort or lower extremity swelling Gastrointestinal:  Denies nausea, heartburn or change in bowel habits Skin: Denies abnormal skin rashes Lymphatics: Denies new lymphadenopathy or easy bruising Neurological:Denies numbness, tingling or  new weaknesses Behavioral/Psych: Mood is stable, no new changes  All other systems were reviewed with the patient and are negative.  I have reviewed the past medical history, past surgical history, social history and family history with the patient and they are unchanged from previous note.  ALLERGIES:  has No Known Allergies.  MEDICATIONS:  Current Outpatient Medications  Medication Sig Dispense Refill  . aspirin 325 MG tablet Take 325 mg by mouth daily.    Marland Kitchen ibuprofen (ADVIL,MOTRIN) 200 MG tablet Take 400-600 mg by mouth every 6 (six) hours as needed for headache.    . Multiple Vitamin (MULTIVITAMIN) tablet Take 1 tablet by mouth daily.     No current facility-administered medications for this visit.    PHYSICAL EXAMINATION: ECOG PERFORMANCE STATUS: 0 - Asymptomatic  Vitals:   03/30/20 1129  BP: 129/77  Pulse: 67  Resp: 16  Temp: 97.6 F (36.4 C)  SpO2: 99%   Filed Weights   03/30/20 1129  Weight: 151 lb (68.5 kg)    GENERAL:alert, no distress and comfortable SKIN: skin color, texture, turgor are normal, no rashes or significant lesions EYES: normal, Conjunctiva are pink and non-injected, sclera clear OROPHARYNX:no exudate, no erythema and lips, buccal mucosa, and tongue normal  NECK: supple, thyroid normal size, non-tender, without nodularity LYMPH:  no palpable lymphadenopathy in the cervical, axillary or inguinal LUNGS: clear to auscultation and percussion with normal breathing effort HEART: regular rate & rhythm and no murmurs and no lower  extremity edema ABDOMEN:abdomen soft, non-tender and normal bowel sounds Musculoskeletal:no cyanosis of digits and no clubbing  NEURO: alert & oriented x 3 with fluent speech, no focal motor/sensory deficits  LABORATORY DATA:  I have reviewed the data as listed    Component Value Date/Time   NA 136 03/30/2020 1119   NA 140 05/16/2017 1346   NA 139 01/10/2017 0908   K 4.3 03/30/2020 1119   K 3.8 01/10/2017 0908   CL 104  03/30/2020 1119   CO2 27 03/30/2020 1119   CO2 27 01/10/2017 0908   GLUCOSE 105 (H) 03/30/2020 1119   GLUCOSE 71 01/10/2017 0908   BUN 11 03/30/2020 1119   BUN 7 05/16/2017 1346   BUN 7.3 01/10/2017 0908   CREATININE 0.98 03/30/2020 1119   CREATININE 0.9 01/10/2017 0908   CALCIUM 8.9 03/30/2020 1119   CALCIUM 9.4 01/10/2017 0908   PROT 6.9 03/30/2020 1119   PROT 6.8 05/16/2017 1346   PROT 7.3 01/10/2017 0908   ALBUMIN 4.3 03/30/2020 1119   ALBUMIN 4.6 05/16/2017 1346   ALBUMIN 4.2 01/10/2017 0908   AST 26 03/30/2020 1119   AST 19 01/10/2017 0908   ALT 34 03/30/2020 1119   ALT 14 01/10/2017 0908   ALKPHOS 71 03/30/2020 1119   ALKPHOS 74 01/10/2017 0908   BILITOT 0.4 03/30/2020 1119   BILITOT 0.3 05/16/2017 1346   BILITOT 0.65 01/10/2017 0908   GFRNONAA >60 03/30/2020 1119   GFRAA >60 01/29/2019 1227    No results found for: SPEP, UPEP  Lab Results  Component Value Date   WBC 7.0 03/30/2020   NEUTROABS 3.9 03/30/2020   HGB 14.9 03/30/2020   HCT 42.6 03/30/2020   MCV 91.8 03/30/2020   PLT 211 03/30/2020      Chemistry      Component Value Date/Time   NA 136 03/30/2020 1119   NA 140 05/16/2017 1346   NA 139 01/10/2017 0908   K 4.3 03/30/2020 1119   K 3.8 01/10/2017 0908   CL 104 03/30/2020 1119   CO2 27 03/30/2020 1119   CO2 27 01/10/2017 0908   BUN 11 03/30/2020 1119   BUN 7 05/16/2017 1346   BUN 7.3 01/10/2017 0908   CREATININE 0.98 03/30/2020 1119   CREATININE 0.9 01/10/2017 0908      Component Value Date/Time   CALCIUM 8.9 03/30/2020 1119   CALCIUM 9.4 01/10/2017 0908   ALKPHOS 71 03/30/2020 1119   ALKPHOS 74 01/10/2017 0908   AST 26 03/30/2020 1119   AST 19 01/10/2017 0908   ALT 34 03/30/2020 1119   ALT 14 01/10/2017 0908   BILITOT 0.4 03/30/2020 1119   BILITOT 0.3 05/16/2017 1346   BILITOT 0.65 01/10/2017 0908

## 2020-03-30 NOTE — Assessment & Plan Note (Signed)
He is working hard to quit smoking We discussed the importance of nicotine cessation He will try his best to quit before I see him back in a year 

## 2020-06-10 ENCOUNTER — Ambulatory Visit: Payer: No Typology Code available for payment source | Admitting: Hematology and Oncology

## 2020-06-10 ENCOUNTER — Other Ambulatory Visit: Payer: No Typology Code available for payment source

## 2021-03-28 ENCOUNTER — Other Ambulatory Visit: Payer: Self-pay

## 2021-03-28 DIAGNOSIS — Z86718 Personal history of other venous thrombosis and embolism: Secondary | ICD-10-CM

## 2021-03-28 DIAGNOSIS — C8118 Nodular sclerosis classical Hodgkin lymphoma, lymph nodes of multiple sites: Secondary | ICD-10-CM

## 2021-03-30 ENCOUNTER — Encounter: Payer: Self-pay | Admitting: Hematology and Oncology

## 2021-03-30 ENCOUNTER — Telehealth: Payer: Self-pay

## 2021-03-30 ENCOUNTER — Inpatient Hospital Stay: Payer: No Typology Code available for payment source

## 2021-03-30 ENCOUNTER — Inpatient Hospital Stay
Payer: No Typology Code available for payment source | Attending: Hematology and Oncology | Admitting: Hematology and Oncology

## 2021-03-30 NOTE — Telephone Encounter (Signed)
Attempted to call regarding missed appts today. Unable to leave a message due to voicemail full.
